# Patient Record
Sex: Female | Born: 1975 | Hispanic: No | Marital: Married | Smoking: Never smoker
Health system: Southern US, Community
[De-identification: ages and names within clinical notes are randomized; demographics above are authoritative.]

## PROBLEM LIST (undated history)

## (undated) DIAGNOSIS — E039 Hypothyroidism, unspecified: Secondary | ICD-10-CM

## (undated) HISTORY — DX: Hypothyroidism, unspecified: E03.9

---

## 2002-10-05 ENCOUNTER — Other Ambulatory Visit: Admission: RE | Admit: 2002-10-05 | Discharge: 2002-10-05 | Payer: Self-pay | Admitting: Obstetrics and Gynecology

## 2003-02-20 ENCOUNTER — Ambulatory Visit (HOSPITAL_COMMUNITY): Admission: AD | Admit: 2003-02-20 | Discharge: 2003-02-20 | Payer: Self-pay | Admitting: Obstetrics and Gynecology

## 2003-03-24 ENCOUNTER — Inpatient Hospital Stay (HOSPITAL_COMMUNITY): Admission: AD | Admit: 2003-03-24 | Discharge: 2003-03-26 | Payer: Self-pay | Admitting: Family Medicine

## 2004-02-22 ENCOUNTER — Emergency Department (HOSPITAL_COMMUNITY): Admission: EM | Admit: 2004-02-22 | Discharge: 2004-02-22 | Payer: Self-pay | Admitting: Emergency Medicine

## 2004-10-13 ENCOUNTER — Ambulatory Visit: Payer: Self-pay | Admitting: Family Medicine

## 2004-12-16 ENCOUNTER — Ambulatory Visit: Payer: Self-pay | Admitting: Family Medicine

## 2004-12-27 ENCOUNTER — Ambulatory Visit: Payer: Self-pay | Admitting: *Deleted

## 2005-01-12 ENCOUNTER — Ambulatory Visit: Payer: Self-pay | Admitting: Family Medicine

## 2005-01-26 ENCOUNTER — Ambulatory Visit: Payer: Self-pay | Admitting: Nurse Practitioner

## 2005-01-29 ENCOUNTER — Encounter: Admission: RE | Admit: 2005-01-29 | Discharge: 2005-01-29 | Payer: Self-pay | Admitting: Family Medicine

## 2005-02-11 ENCOUNTER — Ambulatory Visit: Payer: Self-pay | Admitting: Nurse Practitioner

## 2005-02-17 ENCOUNTER — Ambulatory Visit (HOSPITAL_COMMUNITY): Admission: RE | Admit: 2005-02-17 | Discharge: 2005-02-17 | Payer: Self-pay | Admitting: Internal Medicine

## 2005-03-17 ENCOUNTER — Ambulatory Visit: Payer: Self-pay | Admitting: Nurse Practitioner

## 2005-05-24 ENCOUNTER — Ambulatory Visit: Payer: Self-pay | Admitting: Family Medicine

## 2005-06-08 ENCOUNTER — Ambulatory Visit: Payer: Self-pay | Admitting: Family Medicine

## 2005-08-17 ENCOUNTER — Encounter (INDEPENDENT_AMBULATORY_CARE_PROVIDER_SITE_OTHER): Payer: Self-pay | Admitting: Family Medicine

## 2005-08-17 ENCOUNTER — Ambulatory Visit: Payer: Self-pay | Admitting: Family Medicine

## 2005-12-22 ENCOUNTER — Ambulatory Visit: Payer: Self-pay | Admitting: Family Medicine

## 2006-02-16 ENCOUNTER — Ambulatory Visit: Payer: Self-pay | Admitting: Family Medicine

## 2006-05-12 ENCOUNTER — Ambulatory Visit: Payer: Self-pay | Admitting: Nurse Practitioner

## 2006-07-07 ENCOUNTER — Ambulatory Visit: Payer: Self-pay | Admitting: Internal Medicine

## 2006-08-03 ENCOUNTER — Ambulatory Visit: Payer: Self-pay | Admitting: Family Medicine

## 2006-08-31 ENCOUNTER — Ambulatory Visit: Payer: Self-pay | Admitting: Family Medicine

## 2006-09-14 ENCOUNTER — Ambulatory Visit (HOSPITAL_COMMUNITY): Admission: RE | Admit: 2006-09-14 | Discharge: 2006-09-14 | Payer: Self-pay | Admitting: Family Medicine

## 2006-12-06 ENCOUNTER — Ambulatory Visit: Payer: Self-pay | Admitting: Family Medicine

## 2006-12-21 ENCOUNTER — Encounter (INDEPENDENT_AMBULATORY_CARE_PROVIDER_SITE_OTHER): Payer: Self-pay | Admitting: *Deleted

## 2007-01-17 ENCOUNTER — Ambulatory Visit: Payer: Self-pay | Admitting: Family Medicine

## 2007-02-09 ENCOUNTER — Ambulatory Visit: Payer: Self-pay | Admitting: Family Medicine

## 2007-02-15 ENCOUNTER — Ambulatory Visit (HOSPITAL_COMMUNITY): Admission: RE | Admit: 2007-02-15 | Discharge: 2007-02-15 | Payer: Self-pay | Admitting: Family Medicine

## 2007-04-11 ENCOUNTER — Ambulatory Visit: Payer: Self-pay | Admitting: Family Medicine

## 2007-04-11 LAB — CONVERTED CEMR LAB
ALT: 13 units/L (ref 0–35)
AST: 17 units/L (ref 0–37)
Albumin: 4.2 g/dL (ref 3.5–5.2)
Alkaline Phosphatase: 46 units/L (ref 39–117)
BUN: 13 mg/dL (ref 6–23)
Basophils Absolute: 0 10*3/uL (ref 0.0–0.1)
Basophils Relative: 0 % (ref 0–1)
CO2: 23 meq/L (ref 19–32)
Calcium: 9 mg/dL (ref 8.4–10.5)
Chloride: 103 meq/L (ref 96–112)
Cholesterol: 170 mg/dL (ref 0–200)
Creatinine, Ser: 0.62 mg/dL (ref 0.40–1.20)
Eosinophils Absolute: 0 10*3/uL (ref 0.0–0.7)
Eosinophils Relative: 0 % (ref 0–5)
Glucose, Bld: 83 mg/dL (ref 70–99)
HCT: 40.9 % (ref 36.0–46.0)
HDL: 54 mg/dL (ref >39)
Hemoglobin: 14.2 g/dL (ref 12.0–15.0)
LDL Cholesterol: 90 mg/dL (ref 0–99)
Lymphocytes Relative: 45 % (ref 12–46)
Lymphs Abs: 2.3 10*3/uL (ref 0.7–4.0)
MCHC: 34.7 g/dL (ref 30.0–36.0)
MCV: 92.5 fL (ref 78.0–100.0)
Monocytes Absolute: 0.4 10*3/uL (ref 0.1–1.0)
Monocytes Relative: 7 % (ref 3–12)
Neutro Abs: 2.4 10*3/uL (ref 1.7–7.7)
Neutrophils Relative %: 47 % (ref 43–77)
Platelets: 251 10*3/uL (ref 150–400)
Potassium: 4.3 meq/L (ref 3.5–5.3)
RBC: 4.42 M/uL (ref 3.87–5.11)
RDW: 12.8 % (ref 11.5–15.5)
Sed Rate: 8 mm/hr (ref 0–22)
Sodium: 137 meq/L (ref 135–145)
TSH: 9.236 microintl units/mL — ABNORMAL HIGH (ref 0.350–5.50)
Total Bilirubin: 1.4 mg/dL — ABNORMAL HIGH (ref 0.3–1.2)
Total CHOL/HDL Ratio: 3.1
Total Protein: 7.1 g/dL (ref 6.0–8.3)
Triglycerides: 130 mg/dL (ref <150)
VLDL: 26 mg/dL (ref 0–40)
WBC: 5.1 10*3/uL (ref 4.0–10.5)

## 2007-04-12 ENCOUNTER — Ambulatory Visit (HOSPITAL_COMMUNITY): Admission: RE | Admit: 2007-04-12 | Discharge: 2007-04-12 | Payer: Self-pay | Admitting: Family Medicine

## 2007-08-16 ENCOUNTER — Ambulatory Visit: Payer: Self-pay | Admitting: Family Medicine

## 2007-08-16 LAB — CONVERTED CEMR LAB
Free T4: 1.11 ng/dL (ref 0.89–1.80)
TSH: 2.755 microintl units/mL (ref 0.350–5.50)

## 2007-08-29 ENCOUNTER — Ambulatory Visit: Payer: Self-pay | Admitting: Family Medicine

## 2007-09-19 ENCOUNTER — Ambulatory Visit: Payer: Self-pay | Admitting: Internal Medicine

## 2008-02-08 ENCOUNTER — Ambulatory Visit: Payer: Self-pay | Admitting: Family Medicine

## 2008-04-05 DIAGNOSIS — E039 Hypothyroidism, unspecified: Secondary | ICD-10-CM

## 2008-04-05 HISTORY — DX: Hypothyroidism, unspecified: E03.9

## 2008-06-03 ENCOUNTER — Ambulatory Visit: Payer: Self-pay | Admitting: Internal Medicine

## 2008-10-15 ENCOUNTER — Encounter (INDEPENDENT_AMBULATORY_CARE_PROVIDER_SITE_OTHER): Payer: Self-pay | Admitting: Family Medicine

## 2008-10-15 ENCOUNTER — Ambulatory Visit: Payer: Self-pay | Admitting: Family Medicine

## 2008-11-12 ENCOUNTER — Ambulatory Visit: Payer: Self-pay | Admitting: Family Medicine

## 2008-11-12 LAB — CONVERTED CEMR LAB
Free T4: 0.81 ng/dL (ref 0.80–1.80)
TSH: 14.15 microintl units/mL — ABNORMAL HIGH (ref 0.350–4.500)

## 2008-11-19 ENCOUNTER — Ambulatory Visit: Payer: Self-pay | Admitting: Family Medicine

## 2008-12-31 ENCOUNTER — Ambulatory Visit: Payer: Self-pay | Admitting: Family Medicine

## 2008-12-31 LAB — CONVERTED CEMR LAB
Free T4: 1.23 ng/dL (ref 0.80–1.80)
TSH: 3.399 microintl units/mL (ref 0.350–4.500)

## 2009-01-09 ENCOUNTER — Ambulatory Visit: Payer: Self-pay | Admitting: Internal Medicine

## 2009-04-08 ENCOUNTER — Ambulatory Visit: Payer: Self-pay | Admitting: Family Medicine

## 2009-04-08 LAB — CONVERTED CEMR LAB
ALT: 24 units/L (ref 0–35)
AST: 19 units/L (ref 0–37)
Albumin: 4.6 g/dL (ref 3.5–5.2)
Alkaline Phosphatase: 56 units/L (ref 39–117)
BUN: 11 mg/dL (ref 6–23)
CO2: 24 meq/L (ref 19–32)
Calcium: 9.2 mg/dL (ref 8.4–10.5)
Chlamydia, Swab/Urine, PCR: NEGATIVE
Chloride: 103 meq/L (ref 96–112)
Creatinine, Ser: 0.61 mg/dL (ref 0.40–1.20)
GC Probe Amp, Urine: NEGATIVE
Glucose, Bld: 88 mg/dL (ref 70–99)
Potassium: 4.4 meq/L (ref 3.5–5.3)
Sodium: 139 meq/L (ref 135–145)
Total Bilirubin: 1.1 mg/dL (ref 0.3–1.2)
Total Protein: 7.2 g/dL (ref 6.0–8.3)

## 2009-04-17 ENCOUNTER — Ambulatory Visit: Payer: Self-pay | Admitting: Family Medicine

## 2009-04-17 LAB — CONVERTED CEMR LAB
Chlamydia, DNA Probe: NEGATIVE
GC Probe Amp, Genital: NEGATIVE

## 2009-08-12 ENCOUNTER — Ambulatory Visit: Payer: Self-pay | Admitting: Family Medicine

## 2009-08-12 LAB — CONVERTED CEMR LAB
ALT: <8 units/L (ref 0–35)
AST: 13 units/L (ref 0–37)
Albumin: 4.8 g/dL (ref 3.5–5.2)
Alkaline Phosphatase: 48 units/L (ref 39–117)
Bilirubin, Direct: 0.2 mg/dL (ref 0.0–0.3)
Free T4: 1.35 ng/dL (ref 0.80–1.80)
Helicobacter Pylori Antibody-IgG: 0.5
Indirect Bilirubin: 0.8 mg/dL (ref 0.0–0.9)
TSH: 2.62 microintl units/mL (ref 0.350–4.500)
Total Bilirubin: 1 mg/dL (ref 0.3–1.2)
Total Protein: 7.8 g/dL (ref 6.0–8.3)
Vit D, 25-Hydroxy: 24 ng/mL — ABNORMAL LOW (ref 30–89)

## 2009-09-25 ENCOUNTER — Ambulatory Visit: Payer: Self-pay | Admitting: Family Medicine

## 2009-12-02 ENCOUNTER — Ambulatory Visit: Payer: Self-pay | Admitting: Family Medicine

## 2010-03-03 ENCOUNTER — Encounter (INDEPENDENT_AMBULATORY_CARE_PROVIDER_SITE_OTHER): Payer: Self-pay | Admitting: Family Medicine

## 2010-03-03 LAB — CONVERTED CEMR LAB
Basophils Absolute: 0 10*3/uL (ref 0.0–0.1)
Basophils Relative: 1 % (ref 0–1)
Eosinophils Absolute: 0 10*3/uL (ref 0.0–0.7)
Eosinophils Relative: 1 % (ref 0–5)
Free T4: 1.04 ng/dL (ref 0.80–1.80)
HCT: 43.6 % (ref 36.0–46.0)
Hemoglobin: 14.8 g/dL (ref 12.0–15.0)
Lymphocytes Relative: 48 % — ABNORMAL HIGH (ref 12–46)
Lymphs Abs: 2 10*3/uL (ref 0.7–4.0)
MCHC: 33.9 g/dL (ref 30.0–36.0)
MCV: 93.6 fL (ref 78.0–100.0)
Monocytes Absolute: 0.3 10*3/uL (ref 0.1–1.0)
Monocytes Relative: 8 % (ref 3–12)
Neutro Abs: 1.8 10*3/uL (ref 1.7–7.7)
Neutrophils Relative %: 43 % (ref 43–77)
Platelets: 261 10*3/uL (ref 150–400)
RBC: 4.66 M/uL (ref 3.87–5.11)
RDW: 12.4 % (ref 11.5–15.5)
Rheumatoid fact SerPl-aCnc: <20 intl units/mL (ref 0–20)
Sed Rate: 4 mm/hr (ref 0–22)
TSH: 4.359 microintl units/mL (ref 0.350–4.500)
Uric Acid, Serum: 4.8 mg/dL (ref 2.4–7.0)
WBC: 4.2 10*3/uL (ref 4.0–10.5)

## 2010-07-21 ENCOUNTER — Other Ambulatory Visit: Payer: Self-pay | Admitting: Family Medicine

## 2010-07-21 DIAGNOSIS — N63 Unspecified lump in unspecified breast: Secondary | ICD-10-CM

## 2010-07-29 ENCOUNTER — Ambulatory Visit
Admission: RE | Admit: 2010-07-29 | Discharge: 2010-07-29 | Disposition: A | Discharge status: Home / Self Care | Payer: PRIVATE HEALTH INSURANCE | Source: Ambulatory Visit | Attending: Family Medicine | Admitting: Family Medicine

## 2010-07-29 ENCOUNTER — Other Ambulatory Visit: Payer: Self-pay | Admitting: Family Medicine

## 2010-07-29 DIAGNOSIS — N63 Unspecified lump in unspecified breast: Secondary | ICD-10-CM

## 2011-09-15 ENCOUNTER — Other Ambulatory Visit (HOSPITAL_COMMUNITY): Payer: Self-pay | Admitting: Family Medicine

## 2011-09-15 DIAGNOSIS — R102 Pelvic and perineal pain: Secondary | ICD-10-CM

## 2011-09-15 DIAGNOSIS — N923 Ovulation bleeding: Secondary | ICD-10-CM

## 2011-09-22 ENCOUNTER — Other Ambulatory Visit (HOSPITAL_COMMUNITY): Payer: Self-pay

## 2011-09-22 ENCOUNTER — Inpatient Hospital Stay (HOSPITAL_COMMUNITY): Admission: RE | Admit: 2011-09-22 | Payer: Self-pay | Source: Ambulatory Visit

## 2011-09-30 ENCOUNTER — Ambulatory Visit (HOSPITAL_COMMUNITY)
Admission: RE | Admit: 2011-09-30 | Discharge: 2011-09-30 | Disposition: A | Discharge status: Home / Self Care | Payer: Self-pay | Source: Ambulatory Visit | Attending: Family Medicine | Admitting: Family Medicine

## 2011-09-30 DIAGNOSIS — R102 Pelvic and perineal pain: Secondary | ICD-10-CM

## 2011-09-30 DIAGNOSIS — N898 Other specified noninflammatory disorders of vagina: Secondary | ICD-10-CM | POA: Insufficient documentation

## 2011-09-30 DIAGNOSIS — N923 Ovulation bleeding: Secondary | ICD-10-CM

## 2012-03-28 ENCOUNTER — Encounter (HOSPITAL_COMMUNITY): Payer: Self-pay

## 2012-03-28 ENCOUNTER — Emergency Department (HOSPITAL_COMMUNITY)
Admission: EM | Admit: 2012-03-28 | Discharge: 2012-03-28 | Disposition: A | Discharge status: Home / Self Care | Payer: No Typology Code available for payment source | Source: Home / Self Care

## 2012-03-28 DIAGNOSIS — E039 Hypothyroidism, unspecified: Secondary | ICD-10-CM

## 2012-03-28 LAB — TSH: TSH: 3.846 u[IU]/mL (ref 0.350–4.500)

## 2012-03-28 MED ORDER — NORGESTIM-ETH ESTRAD TRIPHASIC 0.18/0.215/0.25 MG-35 MCG PO TABS: 1.0000 | ORAL_TABLET | Freq: Every day | ORAL | Status: DC

## 2012-03-28 MED ORDER — LEVOTHYROXINE SODIUM 100 MCG PO TABS: 100.0000 ug | ORAL_TABLET | Freq: Every day | ORAL | Status: DC

## 2012-03-28 NOTE — ED Notes (Signed)
Medication refill- thyroid 

## 2012-03-28 NOTE — ED Provider Notes (Addendum)
History     CSN: [id number]  Arrival date & time 03/28/12  1111   First MD Initiated Contact with Patient 03/28/12 1214      Chief Complaint  Patient presents with  . Medication Refill   Patient is here today for medication refill. No complaints today.  (Consider location/radiation/quality/duration/timing/severity/associated sxs/prior treatment) HPI  History reviewed. No pertinent past medical history.  History reviewed. No pertinent past surgical history.  No family history on file.  History  Substance Use Topics  . Smoking status: Not on file  . Smokeless tobacco: Not on file  . Alcohol Use: Not on file    OB History    Grav Para Term Preterm Abortions TAB SAB Ect Mult Living                  Review of Systems  Constitutional: Negative for fever, activity change and appetite change.  HENT: Negative for sore throat.   Respiratory: Negative for cough and shortness of breath.   Cardiovascular: Negative for chest pain and leg swelling.  Gastrointestinal: Negative for nausea, abdominal pain, diarrhea, constipation and abdominal distention.  Genitourinary: Negative for frequency, hematuria and difficulty urinating.  Neurological: Negative for dizziness and headaches.  Psychiatric/Behavioral: Negative for suicidal ideas and behavioral problems.    Allergies  Penicillins  Home Medications  No current outpatient prescriptions on file.  BP 129/86  Pulse 72  Temp 98.9 F (37.2 C) (Oral)  Resp 20  SpO2 100%  LMP 03/24/2012  Physical Exam  Constitutional: She is oriented to person, place, and time. She appears well-developed and well-nourished.  HENT:  Head: Normocephalic and atraumatic.  Eyes: Conjunctivae normal and EOM are normal. Pupils are equal, round, and reactive to light. No scleral icterus.  Neck: Normal range of motion. Neck supple. No JVD present. No thyromegaly present.  Cardiovascular: Normal rate, regular rhythm, normal heart sounds and intact  distal pulses.  Exam reveals no gallop and no friction rub.   No murmur heard. Pulmonary/Chest: Effort normal and breath sounds normal. No respiratory distress. She has no wheezes. She has no rales.  Abdominal: Soft. Bowel sounds are normal. She exhibits no distension and no mass. There is no tenderness. There is no rebound and no guarding.  Musculoskeletal: Normal range of motion. She exhibits no edema and no tenderness.  Lymphadenopathy:    She has no cervical adenopathy.  Neurological: She is alert and oriented to person, place, and time.  Psychiatric: She has a normal mood and affect. Her behavior is normal.    ED Course  Procedures (including critical care time)  Labs Reviewed - No data to display No results found.   No diagnosis found.    MDM  Check TSH today and refill both thyroid medication and birth control medication.        Lars Mage, MD 03/28/12 1230  Lars Mage, MD 03/28/12 1235

## 2012-10-25 ENCOUNTER — Ambulatory Visit: Payer: No Typology Code available for payment source | Attending: Family Medicine | Admitting: Internal Medicine

## 2012-10-25 VITALS — BP 126/76 | HR 66 | Temp 98.3°F | Resp 18 | Wt 153.8 lb

## 2012-10-25 DIAGNOSIS — N390 Urinary tract infection, site not specified: Secondary | ICD-10-CM | POA: Insufficient documentation

## 2012-10-25 DIAGNOSIS — Z0000 Encounter for general adult medical examination without abnormal findings: Secondary | ICD-10-CM | POA: Insufficient documentation

## 2012-10-25 DIAGNOSIS — Z79899 Other long term (current) drug therapy: Secondary | ICD-10-CM | POA: Insufficient documentation

## 2012-10-25 DIAGNOSIS — R300 Dysuria: Secondary | ICD-10-CM

## 2012-10-25 DIAGNOSIS — E039 Hypothyroidism, unspecified: Secondary | ICD-10-CM | POA: Insufficient documentation

## 2012-10-25 DIAGNOSIS — K5900 Constipation, unspecified: Secondary | ICD-10-CM | POA: Insufficient documentation

## 2012-10-25 LAB — POCT URINALYSIS DIPSTICK
Bilirubin, UA: NEGATIVE
Glucose, UA: NEGATIVE
Ketones, UA: NEGATIVE
Nitrite, UA: POSITIVE
Spec Grav, UA: 1.02
Urobilinogen, UA: 0.2
pH, UA: 7

## 2012-10-25 MED ORDER — LEVOTHYROXINE SODIUM 100 MCG PO TABS: 100.0000 ug | ORAL_TABLET | Freq: Every day | ORAL | Status: DC

## 2012-10-25 MED ORDER — CIPROFLOXACIN HCL 500 MG PO TABS: 500.0000 mg | ORAL_TABLET | Freq: Two times a day (BID) | ORAL | Status: DC

## 2012-10-25 MED ORDER — NORGESTIM-ETH ESTRAD TRIPHASIC 0.18/0.215/0.25 MG-35 MCG PO TABS: 1.0000 | ORAL_TABLET | Freq: Every day | ORAL | Status: DC

## 2012-10-25 MED ORDER — SENNOSIDES-DOCUSATE SODIUM 8.6-50 MG PO TABS: 1.0000 | ORAL_TABLET | Freq: Every day | ORAL | Status: DC

## 2012-10-25 NOTE — Progress Notes (Signed)
Interpreter states patient is here for a physical Has not been to a doctor in a while

## 2012-10-25 NOTE — Progress Notes (Signed)
Patient ID: Crystal Fleming, female   DOB: 1975-04-09, 37 y.o.   MRN: [id number] Patient Demographics  Crystal Fleming, is a 37 y.o. female  NUU:[id number]  HKV:[id number]  DOB - 03/25/1976  Chief Complaint  Patient presents with  . Annual Exam        Subjective:   Crystal Fleming, Briefly 37 year old female with history of hypothyroidism is here for a follow up visit.  Patient has No headache, No chest pain,  No new weakness tingling or numbness, No Cough - SOB. Briefly 37 year old female with history of hypothyroidism She has been complaining of dysuria and difficulty urination for last one week. She has been drinking cranberry juice which did help with the symptoms somewhat. She denies any abdominal/flank pain, fever chills, nausea, vomiting. Also complains of constipation, states MiraLax does not help. Last Pap smear was 3 years ago.   Objective:    Filed Vitals:   10/25/12 1027  BP: 126/76  Pulse: 66  Temp: 98.3 F (36.8 C)  Resp: 18  Weight: 153 lb 12.8 oz (69.763 kg)  SpO2: 100%     ALLERGIES:   Allergies  Allergen Reactions  . Penicillins     PAST MEDICAL HISTORY: History reviewed. No pertinent past medical history.  MEDICATIONS AT HOME: Prior to Admission medications   Medication Sig Start Date End Date Taking? Authorizing Provider  levothyroxine (SYNTHROID) 100 MCG tablet Take 1 tablet (100 mcg total) by mouth daily. 10/25/12 10/25/13  Elnathan Fulford Jenna Luo, MD  Norgestimate-Ethinyl Estradiol Triphasic (ORTHO TRI-CYCLEN, 28,) 0.18/0.215/0.25 MG-35 MCG tablet Take 1 tablet by mouth daily. 10/25/12   Sheralyn Pinegar Jenna Luo, MD     Exam  General appearance :Awake, alert, NAD, Speech Clear.  HEENT: Atraumatic and Normocephalic, PERLA Neck: supple, no JVD. No cervical lymphadenopathy.  Chest: Clear to auscultation bilaterally, no wheezing, rales or rhonchi CVS: S1 S2 regular, no murmurs.  Abdomen: soft, NBS, NT, ND, no gaurding, rigidity or  rebound. Extremities: no cyanosis or clubbing, B/L Lower Ext shows no edema Neurology: Awake alert, and oriented X 3, CN II-XII intact, Non focal Skin: No Rash or lesions Wounds:N/A    Data Review   Basic Metabolic Panel: No results found for this basename: NA, K, CL, CO2, GLUCOSE, BUN, CREATININE, CALCIUM, MG, PHOS,  in the last 168 hours Liver Function Tests: No results found for this basename: AST, ALT, ALKPHOS, BILITOT, PROT, ALBUMIN,  in the last 168 hours  CBC: No results found for this basename: WBC, NEUTROABS, HGB, HCT, MCV, PLT,  in the last 168 hours  ------------------------------------------------------------------------------------------------------------------ No results found for this basename: HGBA1C,  in the last 72 hours ------------------------------------------------------------------------------------------------------------------ No results found for this basename: CHOL, HDL, LDLCALC, TRIG, CHOLHDL, LDLDIRECT,  in the last 72 hours ------------------------------------------------------------------------------------------------------------------ No results found for this basename: TSH, T4TOTAL, FREET3, T3FREE, THYROIDAB,  in the last 72 hours ------------------------------------------------------------------------------------------------------------------ No results found for this basename: VITAMINB12, FOLATE, FERRITIN, TIBC, IRON, RETICCTPCT,  in the last 72 hours  Coagulation profile  No results found for this basename: INR, PROTIME,  in the last 168 hours    Assessment & Plan   Active Problems: Hypothroidism - Check TSH, continue Synthroid 100 mcg daily  Constipation - Placed on Senokot S., 1 or 2 tabs at night - encouraged high-fiber diet  Dysuria - Recheck urine dipstick, positive for UTI, place on ciprofloxacin x 3 days, encouraged drinking plenty of fluids, cranberry juice  Health maintenance: last Pap 3 years ago, placed ambulatory referral  to [facility] women[facility] clinic. Refilled  Ortho Tri-Cyclen.  Labs to be checked: UA dipstick ASAP, the patient had no recent labs. Will check CBC, BMET, TSH, lipid panel. She wants them checked today.   Recommendations: f/u on the labs, if anything abnormal will need earlier appointment  Follow-up in 6 months     Keng Jewel M.D. 10/25/2012, 11:06 AM

## 2012-10-26 LAB — CBC WITH DIFFERENTIAL/PLATELET
Basophils Absolute: 0 10*3/uL (ref 0.0–0.1)
Basophils Relative: 0 % (ref 0–1)
Eosinophils Absolute: 0 10*3/uL (ref 0.0–0.7)
Eosinophils Relative: 0 % (ref 0–5)
HCT: 40.4 % (ref 36.0–46.0)
Hemoglobin: 14.2 g/dL (ref 12.0–15.0)
Lymphocytes Relative: 34 % (ref 12–46)
Lymphs Abs: 1.9 10*3/uL (ref 0.7–4.0)
MCH: 32.1 pg (ref 26.0–34.0)
MCHC: 35.1 g/dL (ref 30.0–36.0)
MCV: 91.4 fL (ref 78.0–100.0)
Monocytes Absolute: 0.3 10*3/uL (ref 0.1–1.0)
Monocytes Relative: 6 % (ref 3–12)
Neutro Abs: 3.3 10*3/uL (ref 1.7–7.7)
Neutrophils Relative %: 60 % (ref 43–77)
Platelets: 281 10*3/uL (ref 150–400)
RBC: 4.42 MIL/uL (ref 3.87–5.11)
RDW: 12.6 % (ref 11.5–15.5)
WBC: 5.6 10*3/uL (ref 4.0–10.5)

## 2012-10-26 LAB — LIPID PANEL
Cholesterol: 211 mg/dL — ABNORMAL HIGH (ref 0–200)
HDL: 49 mg/dL (ref >39)
LDL Cholesterol: 121 mg/dL — ABNORMAL HIGH (ref 0–99)
Total CHOL/HDL Ratio: 4.3 Ratio
Triglycerides: 207 mg/dL — ABNORMAL HIGH (ref <150)
VLDL: 41 mg/dL — ABNORMAL HIGH (ref 0–40)

## 2012-10-26 LAB — BASIC METABOLIC PANEL
BUN: 9 mg/dL (ref 6–23)
CO2: 29 mEq/L (ref 19–32)
Calcium: 9.4 mg/dL (ref 8.4–10.5)
Chloride: 100 mEq/L (ref 96–112)
Creat: 0.58 mg/dL (ref 0.50–1.10)
Glucose, Bld: 81 mg/dL (ref 70–99)
Potassium: 4.3 mEq/L (ref 3.5–5.3)
Sodium: 138 mEq/L (ref 135–145)

## 2012-10-26 LAB — TSH: TSH: 3.294 u[IU]/mL (ref 0.350–4.500)

## 2012-10-30 ENCOUNTER — Encounter: Payer: Self-pay | Admitting: Obstetrics and Gynecology

## 2012-12-06 ENCOUNTER — Encounter: Payer: No Typology Code available for payment source | Admitting: Obstetrics and Gynecology

## 2012-12-20 ENCOUNTER — Ambulatory Visit: Payer: No Typology Code available for payment source

## 2013-01-17 ENCOUNTER — Encounter: Payer: Self-pay | Admitting: Obstetrics and Gynecology

## 2013-01-17 ENCOUNTER — Ambulatory Visit (INDEPENDENT_AMBULATORY_CARE_PROVIDER_SITE_OTHER): Payer: No Typology Code available for payment source | Admitting: Obstetrics and Gynecology

## 2013-01-17 VITALS — BP 141/92 | HR 78 | Temp 98.5°F | Ht 61.0 in | Wt 154.4 lb

## 2013-01-17 DIAGNOSIS — R300 Dysuria: Secondary | ICD-10-CM

## 2013-01-17 DIAGNOSIS — Z01419 Encounter for gynecological examination (general) (routine) without abnormal findings: Secondary | ICD-10-CM

## 2013-01-17 LAB — POCT URINALYSIS DIP (DEVICE)
Protein, ur: NEGATIVE mg/dL
Urobilinogen, UA: 0.2 mg/dL (ref 0.0–1.0)

## 2013-01-17 MED ORDER — NORGESTIMATE-ETH ESTRADIOL 0.25-35 MG-MCG PO TABS: 1.0000 | ORAL_TABLET | Freq: Every day | ORAL | Status: DC

## 2013-01-17 MED ORDER — NITROFURANTOIN MONOHYD MACRO 100 MG PO CAPS: 100.0000 mg | ORAL_CAPSULE | Freq: Two times a day (BID) | ORAL | Status: DC

## 2013-01-17 NOTE — Patient Instructions (Signed)
Cuidados preventivos en las mujeres adultas  (Preventive Care for Adults, Female) Un estilo de vida saludable y los cuidados preventivos pueden favorecer la salud y [place]. Las guas para Engineer, building services salud para las mujeres incluyen las siguientes prcticas clave:   Un examen fsico de rutina anual es un buen modo de Chief Operating Officer su salud y Education officer, environmental estudios preventivos. Le da la posibilidad de Agricultural consultant preocupaciones y Solicitor el estado de su salud, y que le realicen estudios completos.  Consulte al dentista para realizar un examen de rutina y cuidados preventivos cada 6 meses. Cepllese los dientes al [employer] veces por da y psese el hilo dental al menos una vez por da. Una buena higiene bucal evita caries y enfermedades de las encas.  La frecuencia con que debe hacerse exmenes de la vista depende de la edad, el estado de [place], la historia familiar, el uso de lentes de contacto y otros factores. Siga las recomendaciones del mdico para saber con qu frecuencia debe hacerse exmenes de la vista.  Consuma una dieta saludable. Los Sun Microsystems, frutas, granos enteros, productos lcteos descremados y protenas magras contienen los nutrientes que usted necesita sin necesidad de consumir muchas caloras. Disminuya el consume de alimentos con alto contenido de grasas slidas, azcar y sal agregadas. Consuma la cantidad de caloras adecuada para usted.Si es necesario, pdale una dieta adecuada al profesional que lo asiste.  La actividad fsica regular es una de las cosas ms importantes que puede hacer por su salud. Los adultos deben hacer al menos 150 minutos de ejercicios de [place] de intensidad moderada (toda actividad que aumente la frecuencia cardaca y lo haga transpirar) cada semana. Adems, la Harley[employer]Davidson de los adultos necesita ejercicios de fortalecimiento muscular 2  ms [employer] and [employer].  Hay que mantener un peso saludable. El ndice de masa corporal [facility]) es una  herramienta que identifica posibles problemas con [place]. Proporciona una estimacin de la grasa corporal basndose en el peso y la altura. [name] podr determinar si [facility] y podr ayudarlo a Personnel officer o Pharmacologist un peso saludable.Para los adultos de 20 aos o ms:  Un [facility] menor a 18,5 se considera bajo peso.  Un [facility] entre 18,5 y 24,9 es normal.  Un [facility] entre 25 y 29,9 es sobrepeso.  Un IMC entre 30 o ms es obesidad.  Mantenga un nivel normal de lpidos y colesterol en sangre practicando actividad fsica y minimizando la ingesta de grasas saturadas. Consuma una dieta balanceada y saludable, e incluya variedad de frutas y vegetales. Los [employer] de lpidos y Oncologist en sangre deben Games developer a los 20 aos y repetirse cada 5 aos. Si los niveles de colesterol son altos, tiene ms de 50 aos o tiene riesgo elevado de sufrir enfermedades cardacas necesitar controlarse con ms frecuencia.Si tiene [employer] Inc de lpidos y colesterol, debe recibir tratamiento con medicamentos, si la dieta y el ejercicio no son efectivos.  Si fuma, consulte con [place] [employer] de las opciones para dejar de [place]. Si no lo hace, no comience.  Si est embarazada no beba alcohol. Si est amamantando, beba alcohol con prudencia. Si elige beber alcohol, no se exceda de 1 medida por da. Se considera una medida a 12 onzas (355 ml) de cerveza, 5 onzas (148 ml) de vino, o 1,5 onzas (44 ml) de licor.  Evite el alcohol y el consumo de drogas. No comparta agujas. Pida ayuda si necesita asistencia o instrucciones con respecto a abandonar el consumo de alcohol, cigarrillos o  drogas.  La hipertensin arterial causa enfermedades cardacas y [place] el riesgo de ictus. Debe controlar su presin arterial al menos cada 1 o 2 aos. La presin arterial elevada que persiste debe tratarse con medicamentos si la prdida de peso y el ejercicio no son efectivos.  Si tiene entre 55 y 34 aos, consulte a su mdico si debe tomar  aspirina para prevenir enfermedades cardacas.  Los anlisis para la diabetes incluyen la toma de [place] de sangre para controlar el nivel de azcar en la sangre durante el [place]. Debe hacerlo cada 3 aos despus de los 45 aos si est dentro de su peso normal y sin factores de riesgo para la diabetes. Las pruebas deben comenzar a edades tempranas o llevarse a cabo con ms frecuencia si tiene sobrepeso y al menos 1 factor de riesgo para la diabetes.  Las evaluaciones para Market researcher de mama son un mtodo preventivo fundamental para las mujeres. Debe practicar la "autoconciencia de las mamas". Esto significa que debe comprender como es la apariencia normal y como se sienten sus mamas e incluir un autoexamen. Si detecta algn cambio, no importa cun pequeo sea, debe informarlo a su mdico. Las mujeres entre 20 y 30 aos deben hacer un examen clnico de las mamas como parte del examen regular de [place], cada 1 a 3 aos. Despus de los 40 aos deben Sprint [employer] Corporation. Deben hacerse una mamografa cada ao, comenzando a los 40 aos. Las mujeres con Brewing technologist de cncer de mama deben hablar con el mdico para hacer un estudio gentico. Las que tienen ms riesgo deben hacerse una ecografa y [place] mamografa todos [place].  Un papanicolau se realiza para diagnosticar cncer de cuello de tero. Muestra los cambios celulares en el cuello que pueden transformarse en cncer si no se tratan. El papanicolau es un procedimiento por el que se obtienen clulas de la parte inferior del tero (cuello) y son examinadas.  Las mujeres deben [employer] un papanicolau a Glass blower/designer de los [address].  [place] 21 y los 29 aos debe repetirse [address].  Luego de los 30 aos, debe realizarse un Papanicolaou cada tres aos siempre que los 3 estudios anteriores sean normales.  Algunas mujeres sufren problemas mdicos que aumenta la probabilidad de Research officer, political party cervical. Consulte a su mdico acerca de estos  problemas. Es muy importante que le informe a su mdico si aparecen nuevos problemas poco despus de su ltimo Papanicolaou. En estos casos, el mdico podr indicar que se realice el Papanicolaou con ms frecuencia.  Estas recomendaciones son las mismas para todas las mujeres hayan recibido o no la vacuna para el VPH (virus del papiloma humano).  Si le han realizado una histerectoma por un problema que no era cncer u otra enfermedad que podra causar cncer, ya no necesitar un Papanicolaou. Sin embargo, si ya no necesita hacerse un Papanicolau, es una buena idea hacerse un examen regularmente para asegurarse de que no hay otros problemas.  Si tiene entre 65 y 55 aos y ha tenido un Papanicolaou normal en los ltimos 10 aos, ya no ser Music therapist. Sin embargo, si ya no necesita hacerse un Papanicolau, es una buena idea hacerse un examen regularmente para asegurarse de que no hay otros problemas.  Si ha recibido un tratamiento para Management consultant cervical o para una enfermedad que podra causar cncer, Musician un Papanicolaou y Paediatric nurse durante al menos 20 aos de concluir el [place].  Si no se ha  hecho el examen con regularidad, debern volver a evaluarse los factores de riesgo (como el tener un nuevo compaero sexual) para Occupational psychologist a Printmaker.  La prueba del VPH es un anlisis adicional que puede usarse para Engineer, site de cuello de tero. Esta prueba busca la presencia del virus que causa los cambios en el cuello. Las [employer]AGE se recolectan durante el papanicolau pueden usarse para el VPH. La prueba para el VPH puede usarse para Development worker, community a mujeres de ms de 30 aos y debe usarse en mujeres de cualquier edad Cisco del papanicolau no sean claros. Despus de los 30 aos, las mujeres deben hacerse el anlisis para el VPH con la misma frecuencia que el papanicolau.  El cncer colorectal puede detectarse y con fecuencia puede  prevenirse. La mayor parte de los estudios de rutina comienzan a los 50 aos y [place] 75 aos. Sin embargo, el mdico podr aconsejarle que lo haga antes, si tiene factores de riesgo para el cncer de colon. Una vez por ao, el profesional le dar un kit de prueba para Recruitment consultant oculta en la materia fecal. Ladonna Snide un tubo con Ladon Applebaum cmara en su extremo para examinar directamente el colon (sigmoidoscopa o colonoscopa), para detectar formas temprana de cncer colorectal. Hable con su mdico si tiene 50 aos, cuando comience con los estudios de [place]. El examen directo del colon debe repetirse cada 5 a 10 aos, hasta los 75 aos, excepto que se encuentren formas tempranas de plipos precancerosos o pequeos bultos.  Se recomienda realizar un anlisis de sangre para Engineer, manufacturing hepatitis C a todas las personas [address] 1944 y 1964, y a todo aquel que tenga un riesgo conocido de haber contrado esta enfermedad.  Practique el sexo seguro. Use condones y evite las prcticas sexuales riesgosas para disminuir el contagio de enfermedades de transmisin sexual. Las enfermedades de transmisin sexual son la gonorrea, clamidia, sfilis, tricomonas, herpes, VPH y el virus de inmunodeficiencia humana (VIH). El herpes, el VIH y [place] VPH son enfermedades virales que no tienen [place]. Pueden producir discapacidad, cncer y [employer]. Las mujeres sexualmente activas de 25 aos o menos deben ser evaluadas para Engineer, manufacturing clamidia. Las Coca Cola que tengan mltiples compaeros tambin deben hacerse el anlisis para Engineer, manufacturing clamidia. Se recomienda realizar anlisis para detectar otras enfermedades de transmisin sexual si es sexualmente [place] y tiene riesgos.  La osteoporosis es una enfermedad en la que los huesos pierden los minerales y la fuerza por el avance de la edad. El resultado pueden ser fracturas graves en los [place]. El riesgo de osteoporosis puede identificarse con Neomia Dear prueba de  densidad sea. Las mujeres de ms de 65 aos y las que tengan riesgos de sufrir fracturas u osteoporosis deben pedir consejo a su mdico. Consulte a su mdico si debe tomar un suplemento de calcio o de vitamina D para reducir el riesgo de osteoporosis.  La menopausia se asocia a sntomas y riesgos fsicos. Se dispone de una terapia de reemplazo hormonal para disminuir los sntomas y [place]. Consulte a su mdico para saber si la terapia de reemplazo hormonal es conveniente para usted.  Use una pantalla solar con un factor SPF de 30 o mayor. Aplique pantalla de Pietro Cassis y repetida a lo largo del Futures trader. Pngase al resguardo del sol cuando la sombra sea ms pequea que usted. Protjase usando mangas y [place], un sombrero de ala ancha y gafas para el sol todo [place],  siempre que se encuentre en el exterior.  Una vez por mes hgase un examen de la piel de todo el cuerpo usando un espejo para ver la espalda. nforme al mdico si aparecen nuevos lunares, los que ya estn tienen bordes [address], los que sean ms grandes que una goma de lpiz o los que hayan cambiado su forma o color.  Mantngase al da con las vacunas.  [place]ripe: Debe aplicarse una dosis todos en cada otoo (o invierno). La composicin de la vacuna de la gripe cambia todos los [place], por lo tanto no es suficiente con vacunarse una vez.  Vacuna antineumocccica de polisacridos Debe aplicarse 1  2 dosis si fuma o si sufre ciertas enfermedades crnicas. Necesitar 1 dosis a los 65 aos (o ms) si nunca se ha vacunado.  Vacuna difteria, ttanos, tos convulsa (DTP). Aplquese una dosis de la vacuna DTaP (vacuna contra la tos convulsa para adultos) si es menor de 65 aos, si tiene ms de 65 aos y est en contacto con un beb, es un trabajador de la [place], es una mujer embarazada o simplemente quiere estar protegido de la enfermedad. Luego necesitar un refuerzo de DT cada 10 aos. Consulte con su mdico si no ha recibido al menos  3 dosis de la vacuna contra el ttanos (y la difteria) en algn momento de su vida o tiene una herida profunda o sucia.  VPH: Debe aplicarse esta vacuna si tiene 26 aos o menos. La vacuna se administra en 3 dosis, generalmente durante el curso de 6 meses.  MMR (sarampin, paperas, rubola) Debe aplicarse al menos una dosis de MMR si ha nacido en 1957 o despus. Podra tambin necesitar una segunda dosis.  Antimeningocccica Si tiene entre 19 y 65 aos y es un estudiante universitario de Therapist, occupational que vive en una residencia estudiantil, o tiene alguna enfermedad mdica, debe recibir esta vacuna. Podra tambin necesitar dosis de refuerzo.  Herpes zoster (culebrilla). Si tiene 60 aos o ms debe aplicarse esta vacuna ahora.  Varicela Si nunca se vacun o slo recibi una dosis, hable con su mdico para averiguar si necesita aplicarse esta vacuna.  Hepatitis A. Debe aplicarse esta vacuna si tiene un factor de riesgo especfico para contraer una infeccin por el virus de la hepatitis A, o simplemente desea estar protegido contra la enfermedad. La vacuna se administra en 2 dosis, con una diferencia entre 6 y 18 meses.  Hepatitis B. Debe aplicarse esta vacuna si tiene un factor de riesgo especfico para contraer una infeccin por el virus de la hepatitis B, o simplemente desea estar protegido contra la enfermedad. La vacuna se administra en 3 dosis, generalmente durante el curso de 6 meses. Controles preventivos - Frecuencia Edad 19 a 39  Control de la presin arterial.** / Cada 1 a 2 aos.  Control de lpidos y colesterol.** / Cada 5 aos, comenzando a los 20 aos.  Examen clnico de mamas.** / Cada 3 aos en las [employer] 20 y los [address] [place].  Papanicolau.** / Cada 2 aos [employer] 21 y los [place]. Despus de los [address] [place], y [place] 65 o 70, con una historia de 3 papanicolau normales consecutivos.  Pruebas para el VPH.** / Cada 3 aos, a partir de los [address] [place], y [place] 65 o 70, con  una historia de 3 papanicolau normales consecutivos.  Anlisis de sangre para la hepatitis C. ** / Para todo individuo con riesgos conocidos para la hepatitis C.  Autoexamen de piel. /  [employer].  Vacuna contra la gripe.** / [employer].  Vacuna antineumocccica de polisacridos.** / Debe aplicarse 1  2 dosis si fuma o si sufre ciertas enfermedades crnicas.  Vacuna difteria, ttanos, tos convulsa (Tdap, Td). / Una dosis nica de vacuna Tdap. Luego necesitar un refuerzo de DT cada 10 aos.  Vacuna Printmaker. / 3 dosis en el curso de 6 meses, si tiene 26 aos o menos.  MMR (sarampin, paperas, rubola). / Debe aplicarse al menos una dosis de MMR si ha nacido en 1957 o despus. Podra tambin necesitar una segunda dosis.  Vacunacin antimeningocccica. / Si tiene entre 41 y 10 aos y es un estudiante universitario de Therapist, occupational que vive en una residencia estudiantil, o tiene alguna enfermedad mdica, debe recibir esta vacuna. Podra tambin necesitar dosis de refuerzo.  Vacuna contra la varicela.** / Consltelo con el mdico.  Vacuna contra la hepatitis A.** / Consltelo con el mdico. 2 dosis, con un intervalo entre 6 a 18 meses.  Vacuna contra la hepatitis B.** / Consltelo con el mdico. 3 dosis en el curso de 6 meses. Edad 40 a 64  Control de la presin arterial.** / Cada 1 a 2 aos.  Control de lpidos y colesterol. **/ Cada 5 aos, comenzando a los 20 aos.  Examen clnico de mamas.** / Todos los aos despus de los 40 aos.  Mamografa.** / Neomia Dear vez por ao a partir de los 40 aos y continuando siempre que tenga buena salud. Consulte con el mdico.  Papanicolau.** / Cada 3 aos despus de los [address] [place], y [place] 65 o 70, con una historia de 3 papanicolau normales consecutivos.  Pruebas para el VPH.** / Cada 3 aos despus de los [address] [place], y [place] 65 o 70, con una historia de 3 papanicolau normales consecutivos.  Prueba de sangre oculta en materia fecal. /  Cada ao comenzando a los 50 aos continuando [employer] 75. No tendr que hacerlo si se ha hecho una colonoscopa cada 10 aos.  Sigmoidoscopa flexible** o colonoscopa.** / Cada 5 aos para la sigmoidoscopa flexible o cada 10 aos para la colonoscopa, comenzando a los 50 aos y continuando [employer] 75 aos.  Anlisis de sangre para la hepatitis C. ** / Para todas las personas [address] 1944 y 81 y a todo aquel que tenga un riesgo conocido para la hepatitis C.  Autoexamen de piel. / Todos los [place].  Vacuna contra la gripe.** / [employer].  Vacuna antineumocccica de polisacridos.** / Debe aplicarse 1  2 dosis si fuma o si sufre ciertas enfermedades crnicas.  Vacuna difteria, ttanos, tos convulsa (Tdap, Td). / Una dosis nica de vacuna Tdap. Luego necesitar un refuerzo de DT cada 10 aos.  MMR (sarampin, paperas, rubola). / Debe aplicarse al menos una dosis de MMR si ha nacido en 1957 o despus. Podra tambin necesitar una segunda dosis.  Vacuna contra la varicela.**/ Consltelo con el mdico.  Vacunacin antimeningocccica. / Consltelo con el mdico.  Vacuna contra la hepatitis A.**/ Consltelo con el mdico. 2 dosis, con un intervalo entre 6 a 18 meses.  Vacuna contra la hepatitis B.** / Consltelo con el mdico. 3 dosis en el curso de 6 meses. Edad 65 o ms  Control de la presin arterial.** / Cada 1 a 2 aos.  Control de lpidos y colesterol. **/ Cada 5 aos, comenzando a los 20 aos.  Examen clnico de mamas.** / Todos los aos despus de los 40  aos.  Ileene Musa.** / Neomia Dear vez por ao a partir de los 40 aos y continuando siempre que tenga buena salud. Consulte con el mdico.  Papanicolau. ** / Cada 3 aos despus de los [address] [place], y [place] 65 o 70, con una historia de 3 papanicolau normales consecutivos. Las pruebas pueden [employer] 65 y los 70 aos, si tiene 3 papanicolau consecutivos normales y no tuvo un papanicoalu ni prueba de VPH [employer] ltimos 10 aos.  Pruebas para el VPH.** / Cada 3 aos despus de los [address], y [place] 65 o 70, con una historia de 3 papanicolau normales consecutivos. Las pruebas pueden [employer] 65 y los 70 aos, si tiene 3 papanicolau consecutivos normales y no tuvo un papanicoalu ni prueba de VPH Apple Computer ltimos 10 aos.  Prueba de sangre oculta en materia fecal. / Cada ao comenzando a los 50 aos continuando [employer] 75. No tendr que hacerlo si se ha hecho una colonoscopa cada 10 aos.  Sigmoidoscopa flexible** o colonoscopa.** / Cada 5 aos para la sigmoidoscopa flexible o cada 10 aos para la colonoscopa, comenzando a los 50 aos y continuando [employer] 75 aos.  Anlisis de sangre para la hepatitis C. ** / Para todas las personas [address] 1944 y 70 y a todo aquel que tenga un riesgo conocido para la hepatitis C.  Pruebas para la osteoporosis.** / Por nica vez en mujeres de ms de 65 aos que tengan riesgo de fracturas u osteoporosis.  Autoexamen de piel. / Todos los [place].  Vacuna contra la gripe.** / [employer].  Vacuna antineumocccica de polisacridos.** / Necesitar 1 dosis a los 65 aos (o ms) si nunca se ha vacunado.  Vacuna difteria, ttanos, tos convulsa (Tdap, Td). / Aplquese una dosis de la vacuna DTaP (vacuna contra la tos convulsa para adultos) si tiene ms de 65 aos y est en contacto con un beb, es un trabajador de [address], es una mujer embarazada o simplemente quiere estar protegido de la enfermedad. Luego necesitar un refuerzo de DT cada 10 aos.  Vacuna contra la varicela.**/ Consltelo con el mdico.  Vacunacin antimeningocccica.** / Consltelo con el mdico.  Vacuna contra la hepatitis A.** / Consltelo con el mdico. 2 dosis, con un intervalo entre 6 a 18 meses.  Vacuna contra la hepatitis B.** / Consulte con el mdico. 3 dosis en el curso de 6 meses. **La historia familiar y personal de riesgos y enfermedades puede  cambiar las recomendaciones del mdico. Document Released: 12/30/2004 Document Revised: 06/14/2011 ExitCare Patient Information 2014 [place], [place].

## 2013-01-17 NOTE — Progress Notes (Signed)
  Subjective:     Crystal Fleming is a 37 y.o. female G3P3 who is here for a comprehensive physical exam. The patient reports no problems.  Patient is sexually active using OCP for contraception  History   Social History  . Marital Status: Single    Spouse Name: N/A    Number of Children: N/A  . Years of Education: N/A   Occupational History  . Not on file.   Social History Main Topics  . Smoking status: Not on file  . Smokeless tobacco: Not on file  . Alcohol Use: Not on file  . Drug Use: Not on file  . Sexual Activity: Not on file   Other Topics Concern  . Not on file   Social History Narrative  . No narrative on file   Health Maintenance  Topic Date Due  . Pap Smear  11/07/1994  . Tetanus/tdap  11/07/1995  . Influenza Vaccine  11/03/2012   No past medical history on file. No past surgical history on file. No family history on file. History  Substance Use Topics  . Smoking status: Not on file  . Smokeless tobacco: Not on file  . Alcohol Use: Not on file        Review of Systems A comprehensive review of systems was negative.   Objective:      GENERAL: Well-developed, well-nourished female in no acute distress.  HEENT: Normocephalic, atraumatic. Sclerae anicteric.  NECK: Supple. Normal thyroid.  LUNGS: Clear to auscultation bilaterally.  HEART: Regular rate and rhythm. BREASTS: Symmetric in size. No palpable masses or lymphadenopathy, skin changes, or nipple drainage. ABDOMEN: Soft, nontender, nondistended. No organomegaly. PELVIC: Normal external female genitalia. Vagina is pink and rugated.  Normal discharge. Normal appearing cervix. Uterus is normal in size. No adnexal mass or tenderness. EXTREMITIES: No cyanosis, clubbing, or edema, 2+ distal pulses.    Assessment:    Healthy female exam.      Plan:    pap smear collected Patient advised to perform monthly self breast exam Patient will be contacted with any abnormal results See  After Visit Summary for Counseling Recommendations

## 2013-01-17 NOTE — Addendum Note (Signed)
Addended by: Jill Side on: 01/17/2013 02:46 PM   Modules accepted: Orders

## 2013-01-20 LAB — URINE CULTURE

## 2013-01-22 ENCOUNTER — Telehealth: Payer: Self-pay | Admitting: *Deleted

## 2013-01-22 MED ORDER — CIPROFLOXACIN HCL 500 MG PO TABS: 500.0000 mg | ORAL_TABLET | Freq: Two times a day (BID) | ORAL | Status: DC

## 2013-01-22 NOTE — Telephone Encounter (Addendum)
Message copied by Jill Side on Mon Jan 22, 2013  2:05 PM ------      Message from: DAY, [place] L      Created: Wed Jan 17, 2013  2:27 PM                   ----- Message -----         From: Catalina Antigua, MD         Sent: 01/17/2013   2:08 PM           To: [facility]            Please inform patient of positive UTI. Rx e-prescribed            Peggy  ------ Consult with Dr. Jolayne Panther regarding Rx for Macrobid- pt does not have insurance and medication will cost in excess of $40.  Rx changed to Cipro. Called pt with Joelene Millin and informed pt of UTI requiring medication.  Pt voiced understanding and will obtain medication.    Diane Day RNC

## 2013-10-01 ENCOUNTER — Ambulatory Visit: Payer: Self-pay | Admitting: Internal Medicine

## 2013-11-08 ENCOUNTER — Ambulatory Visit: Payer: Self-pay | Attending: Internal Medicine | Admitting: Internal Medicine

## 2013-11-08 ENCOUNTER — Encounter: Payer: Self-pay | Admitting: Internal Medicine

## 2013-11-08 VITALS — BP 135/83 | HR 80 | Temp 98.6°F | Resp 16 | Ht 62.21 in | Wt 156.0 lb

## 2013-11-08 DIAGNOSIS — N939 Abnormal uterine and vaginal bleeding, unspecified: Secondary | ICD-10-CM | POA: Insufficient documentation

## 2013-11-08 DIAGNOSIS — E039 Hypothyroidism, unspecified: Secondary | ICD-10-CM | POA: Insufficient documentation

## 2013-11-08 DIAGNOSIS — N926 Irregular menstruation, unspecified: Secondary | ICD-10-CM | POA: Insufficient documentation

## 2013-11-08 DIAGNOSIS — Z01419 Encounter for gynecological examination (general) (routine) without abnormal findings: Secondary | ICD-10-CM | POA: Insufficient documentation

## 2013-11-08 LAB — CBC
HCT: 38.5 % (ref 36.0–46.0)
Hemoglobin: 13.8 g/dL (ref 12.0–15.0)
MCH: 31.6 pg (ref 26.0–34.0)
MCHC: 35.8 g/dL (ref 30.0–36.0)
MCV: 88.1 fL (ref 78.0–100.0)
Platelets: 250 10*3/uL (ref 150–400)
RBC: 4.37 MIL/uL (ref 3.87–5.11)
RDW: 13 % (ref 11.5–15.5)
WBC: 7.8 10*3/uL (ref 4.0–10.5)

## 2013-11-08 LAB — POCT URINE PREGNANCY: Preg Test, Ur: NEGATIVE

## 2013-11-08 MED ORDER — NORGESTIMATE-ETH ESTRADIOL 0.25-35 MG-MCG PO TABS: 1.0000 | ORAL_TABLET | Freq: Every day | ORAL | Status: DC

## 2013-11-08 NOTE — Progress Notes (Signed)
Patient ID: Crystal Fleming, female   DOB: 05/20/1975, 38 y.o.   MRN: [id number]  CC: right sided abdominal pain and irregular menstrual cycles"  HPI: Mrs. Fleming presents today for a follow up for right sided abdominal pain and irregular menses. She reports that August the fourth she started menstruating after having a menstrual period on 10-22-13. She reports that she has been having two menstrual cycles with abdominal pain for years but did not give exact number of years. The flow is reported as average and varying in color from brown to red. She states that she is not a smoker, and recently ran out of oral birth control pills.  Allergies  Allergen Reactions  . Penicillins    Past Medical History  Diagnosis Date  . Hypothyroid 2010   Current Outpatient Prescriptions on File Prior to Visit  Medication Sig Dispense Refill  . ciprofloxacin (CIPRO) 500 MG tablet Take 1 tablet (500 mg total) by mouth 2 (two) times daily.  6 tablet  0  . levothyroxine (SYNTHROID) 100 MCG tablet Take 1 tablet (100 mcg total) by mouth daily.  30 tablet  11  . norgestimate-ethinyl estradiol (ORTHO-CYCLEN,SPRINTEC,PREVIFEM) 0.25-35 MG-MCG tablet Take 1 tablet by mouth daily.  1 Package  11  . Norgestimate-Ethinyl Estradiol Triphasic (ORTHO TRI-CYCLEN, 28,) 0.18/0.215/0.25 MG-35 MCG tablet Take 1 tablet by mouth daily.  1 Package  11  . senna-docusate (SENOKOT S) 8.6-50 MG per tablet Take 1 tablet by mouth at bedtime. May take 2 tabs at night if still constipated  60 tablet  10   No current facility-administered medications on file prior to visit.   History reviewed. No pertinent family history. History   Social History  . Marital Status: Single    Spouse Name: N/A    Number of Children: N/A  . Years of Education: N/A   Occupational History  . Not on file.   Social History Main Topics  . Smoking status: Never Smoker   . Smokeless tobacco: Not on file  . Alcohol Use: No  . Drug Use: No   . Sexual Activity: Not Currently    Birth Control/ Protection: OCP   Other Topics Concern  . Not on file   Social History Narrative  . No narrative on file    Review of Systems  Constitutional: Negative.   HENT: Negative.   Genitourinary: Negative.   Skin: Negative.      Objective:   Filed Vitals:   11/08/13 1721  BP: 135/83  Pulse: 80  Temp: 98.6 F (37 C)  Resp: 16    Physical Exam  Constitutional: She is oriented to person, place, and time. Vital signs are normal. She appears well-developed and well-nourished.  HENT:  Head: Normocephalic.  Eyes: Conjunctivae are normal.  Neck: No thyromegaly present.  Cardiovascular: Normal rate, regular rhythm, S1 normal, S2 normal and normal heart sounds.   Pulmonary/Chest: Effort normal and breath sounds normal.  Abdominal: Soft. Bowel sounds are normal.  Neurological: She is alert and oriented to person, place, and time. She has normal strength. No cranial nerve deficit.  Skin: Skin is warm, dry and intact. No rash noted. No cyanosis. Nails show no clubbing.  Psychiatric: She has a normal mood and affect. Her speech is normal and behavior is normal. Judgment and thought content normal. Cognition and memory are normal.     Lab Results  Component Value Date   WBC 5.6 10/25/2012   HGB 14.2 10/25/2012   HCT 40.4 10/25/2012   MCV 91.4  10/25/2012   PLT 281 10/25/2012   Lab Results  Component Value Date   CREATININE 0.58 10/25/2012   BUN 9 10/25/2012   NA 138 10/25/2012   K 4.3 10/25/2012   CL 100 10/25/2012   CO2 29 10/25/2012    No results found for this basename: HGBA1C   Lipid Panel     Component Value Date/Time   CHOL 211* 10/25/2012 1103   TRIG 207* 10/25/2012 1103   HDL 49 10/25/2012 1103   CHOLHDL 4.3 10/25/2012 1103   VLDL 41* 10/25/2012 1103   LDLCALC 121* 10/25/2012 1103       Assessment and plan:   Shreeya was seen today for follow-up and hypothyroidism.  Diagnoses and associated orders for this  visit:  Unspecified hypothyroidism - TSH - T4, Free  Abnormal uterine bleeding - POCT urine pregnancy - Ambulatory referral to Gynecology - CBC  Routine gynecological examination - norgestimate-ethinyl estradiol (ORTHO-CYCLEN,SPRINTEC,PREVIFEM) 0.25-35 MG-MCG tablet; Take 1 tablet by mouth daily.   Return in about 6 months (around 05/11/2014) for hypothyroidism.        Holland Commons, NP-C [facility] [phone] 11/08/2013, 5:53 PM

## 2013-11-08 NOTE — Progress Notes (Signed)
Patient presents for f/u on hypothyroidism Requesting med refills C/O 1 week history of "right ovary" pain Also c/o vaginal bleeding for last 2 days even though LMP 10/22/13  States ran out of OBC 1 month ago after being on for 9 years

## 2013-11-09 LAB — TSH: TSH: 2.894 u[IU]/mL (ref 0.350–4.500)

## 2013-11-09 LAB — T4, FREE: FREE T4: 1.04 ng/dL (ref 0.80–1.80)

## 2013-11-19 ENCOUNTER — Telehealth: Payer: Self-pay | Admitting: Internal Medicine

## 2013-11-19 DIAGNOSIS — E039 Hypothyroidism, unspecified: Secondary | ICD-10-CM

## 2013-11-19 NOTE — Telephone Encounter (Signed)
Pt. Is calling in regards to lab work performed on 11/08/13...the patient will need spanish interpreter.Marland Kitchen..Marland Kitchen

## 2013-11-22 MED ORDER — LEVOTHYROXINE SODIUM 100 MCG PO TABS: 100.0000 ug | ORAL_TABLET | Freq: Every day | ORAL | Status: DC

## 2013-11-22 NOTE — Telephone Encounter (Signed)
Patient notified via in-house interpreter that all labs are normal and that she should continue on same dosage of synthroid. Synthroid 100 mcg  #30 with 2 refillse-scribed [facility] [address] Patient aware that referral has been made for GYN

## 2013-11-22 NOTE — Telephone Encounter (Signed)
Message copied by Fredderick SeveranceUCATTE, Fernandez Kenley L on Thu Nov 22, 2013 12:20 PM ------      Message from: Holland CommonsKECK, VALERIE A      Created: Fri Nov 09, 2013  6:01 PM       Thyroid function is normal we will keep with the same dose of Synthroid. Please send refill for Synthroid for patient ------

## 2013-12-19 ENCOUNTER — Ambulatory Visit (INDEPENDENT_AMBULATORY_CARE_PROVIDER_SITE_OTHER): Payer: Self-pay | Admitting: Obstetrics and Gynecology

## 2013-12-19 ENCOUNTER — Other Ambulatory Visit (HOSPITAL_COMMUNITY)
Admission: RE | Admit: 2013-12-19 | Discharge: 2013-12-19 | Disposition: A | Discharge status: Home / Self Care | Payer: Self-pay | Source: Ambulatory Visit | Attending: Obstetrics and Gynecology | Admitting: Obstetrics and Gynecology

## 2013-12-19 ENCOUNTER — Encounter: Payer: Self-pay | Admitting: Obstetrics and Gynecology

## 2013-12-19 VITALS — BP 130/81 | HR 75 | Temp 99.3°F | Ht 61.0 in | Wt 156.2 lb

## 2013-12-19 DIAGNOSIS — N926 Irregular menstruation, unspecified: Secondary | ICD-10-CM

## 2013-12-19 DIAGNOSIS — Z01812 Encounter for preprocedural laboratory examination: Secondary | ICD-10-CM

## 2013-12-19 DIAGNOSIS — N939 Abnormal uterine and vaginal bleeding, unspecified: Secondary | ICD-10-CM | POA: Insufficient documentation

## 2013-12-19 DIAGNOSIS — R87619 Unspecified abnormal cytological findings in specimens from cervix uteri: Secondary | ICD-10-CM | POA: Insufficient documentation

## 2013-12-19 LAB — POCT PREGNANCY, URINE: PREG TEST UR: NEGATIVE

## 2013-12-19 NOTE — Progress Notes (Signed)
Patient ID: Crystal Fleming, female   DOB: December 12, 1975, 38 y.o.   MRN: [id number] 38 yo G3P3 with BMI 29 presenting today for the evaluation of abnormal uterine bleeding. Patient reports having abnormal bleeding pattern for several years which was controlled with OCP. She did not refill her OCP and has noted experiencing 2 periods per month lasting 4-5 days each over the past 5-6 months. Patient reports bleeding is associated with some dysmenorrhea  Past Medical History  Diagnosis Date  . Hypothyroid 2010   No past surgical history on file. No family history on file. History  Substance Use Topics  . Smoking status: Never Smoker   . Smokeless tobacco: Not on file  . Alcohol Use: No   GENERAL: Well-developed, well-nourished female in no acute distress.  ABDOMEN: Soft, nontender, nondistended. No organomegaly. PELVIC: Normal external female genitalia. Vagina is pink and rugated.  Normal discharge. Normal appearing cervix. Uterus is normal in size. No adnexal mass or tenderness. EXTREMITIES: No cyanosis, clubbing, or edema, 2+ distal pulses.  A/P 38 yo G3P3 with DUB - Discussed the need for an endometrial biopsy ENDOMETRIAL BIOPSY     The indications for endometrial biopsy were reviewed.   Risks of the biopsy including cramping, bleeding, infection, uterine perforation, inadequate specimen and need for additional procedures  were discussed. The patient states she understands and agrees to undergo procedure today. Consent was signed. Time out was performed. Urine HCG was negative. A sterile speculum was placed in the patient's vagina and the cervix was prepped with Betadine. A single-toothed tenaculum was placed on the anterior lip of the cervix to stabilize it. The uterine cavity was sounded to a depth of 9 cm using the uterine sound. The 3 mm pipelle was introduced into the endometrial cavity without difficulty, 2 passes were made.  A  moderate amount of tissue was  sent to pathology. The  instruments were removed from the patient's vagina. Minimal bleeding from the cervix was noted. The patient tolerated the procedure well.  Routine post-procedure instructions were given to the patient. The patient will follow up in two weeks to review the results and for further management.   - Discussed medical management of DUB but patient is uncertain right now

## 2013-12-19 NOTE — Progress Notes (Signed)
Patient reports having two periods per month for the past 5 months. She bleeds for 3-4 days.

## 2014-01-09 ENCOUNTER — Encounter: Payer: Self-pay | Admitting: Obstetrics and Gynecology

## 2014-01-09 ENCOUNTER — Ambulatory Visit (INDEPENDENT_AMBULATORY_CARE_PROVIDER_SITE_OTHER): Payer: Self-pay | Admitting: Obstetrics and Gynecology

## 2014-01-09 VITALS — BP 123/82 | HR 79 | Temp 98.3°F | Resp 20 | Ht 62.0 in | Wt 158.1 lb

## 2014-01-09 DIAGNOSIS — Z712 Person consulting for explanation of examination or test findings: Secondary | ICD-10-CM

## 2014-01-09 DIAGNOSIS — Z7189 Other specified counseling: Secondary | ICD-10-CM

## 2014-01-09 NOTE — Progress Notes (Signed)
Patient ID: [name], female   DOB: 06/27/1975, 38 y.o.   MRN: [id number] 38 yo with DUB presenting today to discuss results of her endometrial biopsy and further management of her DUB. Patient has been taking OCP but has not taken them for the past 8 days due to issues with her pharmacy.   Endometrium, biopsy - SECRETORY ENDOMETRIUM. - NO HYPERPLASIA OR MALIGNANCY.  A/P 38 yo with DUB - results of endometrial biopsy reviewed and explained to the patient - Advised to continue OCP daily. Informed patient to expect irregular bleeding with first pack - advised patient to use condoms for the first 3 weeks - RTC in 6 months for follow up - Patient voiced a desire for permanent sterilization. Will have patient speak with Burman FosterReyna in financial department. If patient wishes to pursue this option, she is to contact our clinic to inform us of her decision in order to be scheduled. Risks, benefits and alternatives were reviewed and explained to the patient. If patient wishes to proceed, she will be scheduled for a laparoscopic bilateral salpingectomy

## 2014-01-16 ENCOUNTER — Other Ambulatory Visit: Payer: Self-pay | Admitting: Internal Medicine

## 2014-02-04 ENCOUNTER — Encounter: Payer: Self-pay | Admitting: Obstetrics and Gynecology

## 2014-03-04 ENCOUNTER — Other Ambulatory Visit: Payer: Self-pay | Admitting: Internal Medicine

## 2014-03-08 ENCOUNTER — Telehealth: Payer: Self-pay | Admitting: Internal Medicine

## 2014-03-08 NOTE — Telephone Encounter (Signed)
Pt requesting refill for thyroid medications, pt is due for f/u in February. Pt uses [facility] [address], please f/u with pt.

## 2014-03-14 ENCOUNTER — Other Ambulatory Visit: Payer: Self-pay | Admitting: Emergency Medicine

## 2014-03-14 ENCOUNTER — Telehealth: Payer: Self-pay | Admitting: Internal Medicine

## 2014-03-14 DIAGNOSIS — E039 Hypothyroidism, unspecified: Secondary | ICD-10-CM

## 2014-03-14 MED ORDER — LEVOTHYROXINE SODIUM 100 MCG PO TABS: 100.0000 ug | ORAL_TABLET | Freq: Every day | ORAL | Status: DC

## 2014-03-14 NOTE — Telephone Encounter (Signed)
Pt. Is calling to request a medication refill for levothyroxine (SYNTHROID) 100 MCG tablet , please f/u with pt.

## 2014-03-14 NOTE — Telephone Encounter (Signed)
Medication reordered and e-scribed to [facility] 

## 2014-03-22 ENCOUNTER — Encounter: Payer: Self-pay | Admitting: *Deleted

## 2014-03-27 ENCOUNTER — Ambulatory Visit: Payer: Self-pay | Attending: Internal Medicine

## 2014-04-10 ENCOUNTER — Encounter: Payer: Self-pay | Admitting: Internal Medicine

## 2014-04-10 ENCOUNTER — Ambulatory Visit: Payer: Self-pay | Attending: Internal Medicine | Admitting: Internal Medicine

## 2014-04-10 ENCOUNTER — Ambulatory Visit (HOSPITAL_BASED_OUTPATIENT_CLINIC_OR_DEPARTMENT_OTHER): Payer: Self-pay | Admitting: *Deleted

## 2014-04-10 VITALS — BP 123/76 | HR 68 | Temp 98.0°F | Resp 16 | Ht 62.0 in | Wt 158.0 lb

## 2014-04-10 DIAGNOSIS — Z23 Encounter for immunization: Secondary | ICD-10-CM

## 2014-04-10 DIAGNOSIS — Z79899 Other long term (current) drug therapy: Secondary | ICD-10-CM | POA: Insufficient documentation

## 2014-04-10 DIAGNOSIS — Z880 Allergy status to penicillin: Secondary | ICD-10-CM | POA: Insufficient documentation

## 2014-04-10 DIAGNOSIS — E039 Hypothyroidism, unspecified: Secondary | ICD-10-CM

## 2014-04-10 DIAGNOSIS — K5900 Constipation, unspecified: Secondary | ICD-10-CM

## 2014-04-10 LAB — TSH: TSH: 3.771 u[IU]/mL (ref 0.350–4.500)

## 2014-04-10 LAB — T4, FREE: FREE T4: 1.06 ng/dL (ref 0.80–1.80)

## 2014-04-10 NOTE — Progress Notes (Signed)
Patient ID: Crystal Fleming, female   DOB: [birth year], 39 y.o.   MRN: [id number]  CC: thyroid disease and fatigue HPI: Crystal Fleming is a 39 y.o. female here today for a follow up visit.  Patient has past medical history of hypothyroidism. Patient reports that her feet and legs feel tired.  She reports that she usually exercises daily but she has been having more difficulty with fatigue lately.  She has noticed some fluctuation in weight and constipation. She admits to feelings of nervousness.     Patient has No headache, No chest pain, No abdominal pain - No Nausea, No new weakness tingling or numbness, No Cough - SOB.  Allergies  Allergen Reactions  . Penicillins    Past Medical History  Diagnosis Date  . Hypothyroid 2010   Current Outpatient Prescriptions on File Prior to Visit  Medication Sig Dispense Refill  . levothyroxine (SYNTHROID) 100 MCG tablet Take 1 tablet (100 mcg total) by mouth daily. 30 tablet 2  . norgestimate-ethinyl estradiol (ORTHO-CYCLEN,SPRINTEC,PREVIFEM) 0.25-35 MG-MCG tablet Take 1 tablet by mouth daily. 1 Package 11  . senna-docusate (SENOKOT S) 8.6-50 MG per tablet Take 1 tablet by mouth at bedtime. May take 2 tabs at night if still constipated (Patient not taking: Reported on 04/10/2014) 60 tablet 10   No current facility-administered medications on file prior to visit.   History reviewed. No pertinent family history. History   Social History  . Marital Status: Single    Spouse Name: N/A    Number of Children: N/A  . Years of Education: N/A   Occupational History  . Not on file.   Social History Main Topics  . Smoking status: Never Smoker   . Smokeless tobacco: Not on file  . Alcohol Use: No  . Drug Use: No  . Sexual Activity: Not Currently    Birth Control/ Protection: OCP   Other Topics Concern  . Not on file   Social History Narrative    Review of Systems: See HPI   Objective:   Filed Vitals:   04/10/14 1037   BP: 123/76  Pulse: 68  Temp: 98 F (36.7 C)  Resp: 16    Physical Exam  Constitutional: She is oriented to person, place, and time.  Neck: Normal range of motion. No thyromegaly present.  Cardiovascular: Normal rate, regular rhythm and normal heart sounds.   Pulmonary/Chest: Effort normal and breath sounds normal.  Lymphadenopathy:    She has no cervical adenopathy.  Neurological: She is alert and oriented to person, place, and time.  Skin: Skin is warm and dry.     Lab Results  Component Value Date   WBC 7.8 11/08/2013   HGB 13.8 11/08/2013   HCT 38.5 11/08/2013   MCV 88.1 11/08/2013   PLT 250 11/08/2013   Lab Results  Component Value Date   CREATININE 0.58 10/25/2012   BUN 9 10/25/2012   NA 138 10/25/2012   K 4.3 10/25/2012   CL 100 10/25/2012   CO2 29 10/25/2012    No results found for: HGBA1C Lipid Panel     Component Value Date/Time   CHOL 211* 10/25/2012 1103   TRIG 207* 10/25/2012 1103   HDL 49 10/25/2012 1103   CHOLHDL 4.3 10/25/2012 1103   VLDL 41* 10/25/2012 1103   LDLCALC 121* 10/25/2012 1103       Assessment and plan:   Crystal Fleming was seen today for follow-up.  Diagnoses and associated orders for this visit:  Hypothyroidism, unspecified hypothyroidism type - TSH -  T4, Free - Vitamin D, 25-hydroxy  Constipation, unspecified constipation type Advised to try Miralax and colace daily  Need for influenza vaccination Influenza injection received.  Explained side effects and contraindications to patient. Information sheet given to patient.  Will call with TSH results.  Return in about 6 months (around 10/09/2014) for hypothyroidism.   Holland CommonsKECK, Deyana Wnuk, NP-C [facility] [phone] 04/10/2014, 10:51 AM

## 2014-04-10 NOTE — Progress Notes (Signed)
Pt is here following up on her thyroid disease.  Pt states that her legs and feet feel sore and tired. Pt has an interpreter.

## 2014-04-10 NOTE — Patient Instructions (Signed)
Estreimiento (Constipation) Estreimiento significa que una persona tiene menos de tres evacuaciones en una semana, dificultad para defecar, o que las heces son secas, duras, o ms grandes que lo normal. A medida que envejecemos el estreimiento es ms comn. Si intenta curar el estreimiento con medicamentos que producen la evacuacin de las heces (laxantes), el problema puede empeorar. El uso prolongado de laxantes puede hacer que los msculos del colon se debiliten. Una dieta baja en fibra, no tomar suficientes lquidos y el uso de ciertos medicamentos pueden empeorar el estreimiento.  CAUSAS   Ciertos medicamentos, como los antidepresivos, analgsicos, suplementos de hierro, anticidos y diurticos.  Algunas enfermedades, como la diabetes, el sndrome del colon irritable, enfermedad de la tiroides, o depresin.  No beber suficiente agua.  No consumir suficientes alimentos ricos en fibra.  Situaciones de estrs o viajes.  Falta de actividad fsica o de ejercicio.  Ignorar la necesidad sbita de defecar.  Uso en exceso de laxantes. SIGNOS Y SNTOMAS   Defecar menos de tres veces por semana.  Dificultad para defecar.  Tener las heces secas y duras, o ms grandes que las normales.  Sensacin de estar lleno o hinchado.  Dolor en la parte baja del abdomen.  No sentir alivio despus de defecar. DIAGNSTICO  [name] le har una historia clnica y un examen fsico. Pueden hacerle exmenes adicionales para el estreimiento grave. Estos estudios pueden ser:  Un radiografa con enema de bario para examinar el recto, el colon y, en algunos casos, el intestino delgado.  Una sigmoidoscopia para examinar el colon inferior.  Una colonoscopia para examinar todo el colon. TRATAMIENTO  El tratamiento depender de la gravedad del estreimiento y de la causa. Algunos tratamientos nutricionales son beber ms lquidos y comer ms alimentos ricos en fibra. El cambio en el estilo de vida  incluye hacer ejercicios de manera regular. Si estas recomendaciones para realizar cambios en la dieta y en el estilo de vida no ayudan, el mdico le puede indicar el uso de laxantes de venta libre para ayudarlo a defecar. Los medicamentos recetados se pueden prescribir si los medicamentos de venta libre no lo ayudan.  INSTRUCCIONES PARA EL CUIDADO EN EL HOGAR   Consuma alimentos con alto contenido de fibra, como frutas, vegetales, cereales integrales y porotos.  Limite los alimentos procesados ricos en grasas y azcar, como las papas fritas, hamburguesas, galletas, dulces y refrescos.  Puede agregar un suplemento de fibra a su dieta si no obtiene lo suficiente de los alimentos.  Beba suficiente lquido para mantener la orina clara o de color amarillo plido.  Haga ejercicio regularmente o segn las indicaciones del mdico.  Vaya al bao cuando sienta la necesidad de ir. No se aguante las ganas.  Tome solo medicamentos de venta libre o recetados, segn las indicaciones del mdico. No tome otros medicamentos para el estreimiento sin consultarlo antes con su mdico. SOLICITE ATENCIN MDICA DE INMEDIATO SI:   Observa sangre brillante en las heces.  El estreimiento dura ms de 4 das o empeora.  Siente dolor abdominal o rectal.  Las heces son delgadas como un lpiz.  Pierde peso de manera inexplicable. ASEGRESE DE QUE:   Comprende estas instrucciones.  Controlar su afeccin.  Recibir ayuda de inmediato si no mejora o si empeora. Document Released: 04/11/2007 Document Revised: 03/27/2013 ExitCare Patient Information 2015 [employer]. This information is not intended to replace advice given to you by your health care provider. Make sure you discuss any questions you have with your health   care provider.  

## 2014-04-11 LAB — VITAMIN D 25 HYDROXY (VIT D DEFICIENCY, FRACTURES): VIT D 25 HYDROXY: 13 ng/mL — AB (ref 30–100)

## 2014-04-30 ENCOUNTER — Telehealth: Payer: Self-pay | Admitting: *Deleted

## 2014-04-30 MED ORDER — VITAMIN D (ERGOCALCIFEROL) 1.25 MG (50000 UNIT) PO CAPS: 50000.0000 [IU] | ORAL_CAPSULE | ORAL | Status: DC

## 2014-04-30 NOTE — Telephone Encounter (Signed)
-----   Message from Ambrose FinlandValerie A Keck, NP sent at 04/23/2014 10:59 PM EST ----- TSH is normal, she may stay on current dose. Please refill if needed. Vitamin D is low. Drisdol 50,000 IU to take once weekly for 12 weeks. 12 tables no refills.

## 2014-04-30 NOTE — Telephone Encounter (Signed)
Rx was send to [facility] Pt aware of lab results

## 2014-06-13 ENCOUNTER — Encounter: Payer: Self-pay | Admitting: Internal Medicine

## 2014-06-13 ENCOUNTER — Ambulatory Visit: Payer: Self-pay | Attending: Internal Medicine | Admitting: Internal Medicine

## 2014-06-13 NOTE — Progress Notes (Signed)
Patient ID: Crystal MileMarfelia Fleming, female   DOB: 10/02/1975, 39 y.o.   MRN: [id number] Patient left before being seen. States that she does not want a bill from [facility]

## 2014-06-19 ENCOUNTER — Telehealth: Payer: Self-pay | Admitting: Internal Medicine

## 2014-06-19 NOTE — Telephone Encounter (Signed)
Pt came into clinic requesting medication refill for levothyroxine (SYNTHROID) 100 MCG tablet. Pt is out of medication and is requesting med to be sent to our pharmacy. Please f/u with pt

## 2014-06-24 ENCOUNTER — Telehealth: Payer: Self-pay | Admitting: Internal Medicine

## 2014-06-24 NOTE — Telephone Encounter (Signed)
Pt is requesting a refill for levothyroxine (SYNTHROID) 100 MCG tablet. Pt is out of medications. She is wanting to use our pharmacy because she is hoping to get them to last atleast 2 months [facility]

## 2014-06-28 ENCOUNTER — Telehealth: Payer: Self-pay | Admitting: Internal Medicine

## 2014-06-28 NOTE — Telephone Encounter (Signed)
Pt called requesting medication refill on levothyroxine (SYNTHROID) 100 MCG tablet. Pt states she is completely out and would like to get 90 day supply instead of 30 day. Please f/u with pt

## 2014-06-28 NOTE — Telephone Encounter (Signed)
Patient is out of her levothyroxine (SYNTHROID) 100 MCG tablet. [facility] and they are saying that she has no more refills. Please follow up with pt.

## 2014-06-30 ENCOUNTER — Other Ambulatory Visit: Payer: Self-pay | Admitting: Internal Medicine

## 2014-07-01 ENCOUNTER — Other Ambulatory Visit: Payer: Self-pay | Admitting: Internal Medicine

## 2014-07-01 DIAGNOSIS — E039 Hypothyroidism, unspecified: Secondary | ICD-10-CM

## 2014-07-01 MED ORDER — LEVOTHYROXINE SODIUM 100 MCG PO TABS
100.0000 ug | ORAL_TABLET | Freq: Every day | ORAL | Status: DC
Start: 2014-07-01 — End: 2014-07-10

## 2014-07-01 NOTE — Telephone Encounter (Signed)
[facility]

## 2014-07-01 NOTE — Telephone Encounter (Signed)
Pt called requesting medication refill on levothyroxine (SYNTHROID) 100 MCG tablet. Please f/u with pt

## 2014-07-02 ENCOUNTER — Telehealth: Payer: Self-pay | Admitting: Internal Medicine

## 2014-07-02 NOTE — Telephone Encounter (Signed)
Patient called to request a med refill her Thyroid medication, patient has an upcoming appointment but needs enough medication to last her until then, please f/u with pt.

## 2014-07-08 ENCOUNTER — Ambulatory Visit: Payer: Self-pay | Admitting: Internal Medicine

## 2014-07-10 ENCOUNTER — Ambulatory Visit: Payer: Self-pay | Attending: Internal Medicine | Admitting: Internal Medicine

## 2014-07-10 ENCOUNTER — Encounter: Payer: Self-pay | Admitting: Internal Medicine

## 2014-07-10 VITALS — BP 118/79 | HR 58 | Temp 98.3°F | Resp 16 | Ht 62.0 in | Wt 157.0 lb

## 2014-07-10 DIAGNOSIS — E039 Hypothyroidism, unspecified: Secondary | ICD-10-CM | POA: Insufficient documentation

## 2014-07-10 MED ORDER — LEVOTHYROXINE SODIUM 100 MCG PO TABS: 100.0000 ug | ORAL_TABLET | Freq: Every day | ORAL | Status: DC

## 2014-07-10 NOTE — Progress Notes (Signed)
Patient ID: Crystal Fleming, female   DOB: 06/01/1975, 39 y.o.   MRN: [id number]  CC: medication refill   HPI: Crystal Fleming is a 39 y.o. female here today for a follow up visit.  Patient has past medical history of of hypothyroidism.  She reports that she was unable to get refills of her medications and needed a appointment to get medications. She has no chief complaint today.   Patient has No headache, No chest pain, No abdominal pain - No Nausea, No new weakness tingling or numbness, No Cough - SOB.  Allergies  Allergen Reactions  . Penicillins    Past Medical History  Diagnosis Date  . Hypothyroid 2010   Current Outpatient Prescriptions on File Prior to Visit  Medication Sig Dispense Refill  . levothyroxine (SYNTHROID) 100 MCG tablet Take 1 tablet (100 mcg total) by mouth daily. (Patient not taking: Reported on 07/10/2014) 90 tablet 0  . norgestimate-ethinyl estradiol (ORTHO-CYCLEN,SPRINTEC,PREVIFEM) 0.25-35 MG-MCG tablet Take 1 tablet by mouth daily. (Patient not taking: Reported on 07/10/2014) 1 Package 11  . senna-docusate (SENOKOT S) 8.6-50 MG per tablet Take 1 tablet by mouth at bedtime. May take 2 tabs at night if still constipated (Patient not taking: Reported on 04/10/2014) 60 tablet 10  . Vitamin D, Ergocalciferol, (DRISDOL) 50000 UNITS CAPS capsule Take 1 capsule (50,000 Units total) by mouth every 7 (seven) days. (Patient not taking: Reported on 07/10/2014) 12 capsule 0   No current facility-administered medications on file prior to visit.   History reviewed. No pertinent family history. History   Social History  . Marital Status: Single    Spouse Name: N/A  . Number of Children: N/A  . Years of Education: N/A   Occupational History  . Not on file.   Social History Main Topics  . Smoking status: Never Smoker   . Smokeless tobacco: Not on file  . Alcohol Use: No  . Drug Use: No  . Sexual Activity: Not Currently    Birth Control/ Protection: OCP    Other Topics Concern  . Not on file   Social History Narrative    Review of Systems: Constitutional: Negative for fever, chills, diaphoresis, activity change, appetite change and fatigue. HENT: Negative for ear pain, nosebleeds, congestion, facial swelling, rhinorrhea, neck pain, neck stiffness and ear discharge.  Eyes: Negative for pain, discharge, redness, itching and visual disturbance. Respiratory: Negative for cough, choking, chest tightness, shortness of breath, wheezing and stridor.  Cardiovascular: Negative for chest pain, palpitations and leg swelling. Gastrointestinal: Negative for abdominal distention. Genitourinary: Negative for dysuria, urgency, frequency, hematuria, flank pain, decreased urine volume, difficulty urinating and dyspareunia.  Musculoskeletal: Negative for back pain, joint swelling, arthralgias and gait problem. Neurological: Negative for dizziness, tremors, seizures, syncope, facial asymmetry, speech difficulty, weakness, light-headedness, numbness and headaches.  Hematological: Negative for adenopathy. Does not bruise/bleed easily. Psychiatric/Behavioral: Negative for hallucinations, behavioral problems, confusion, dysphoric mood, decreased concentration and agitation.    Objective:   Filed Vitals:   07/10/14 1233  BP: 118/79  Pulse: 58  Temp: 98.3 F (36.8 C)  Resp: 16    Physical Exam Not completed   Lab Results  Component Value Date   WBC 7.8 11/08/2013   HGB 13.8 11/08/2013   HCT 38.5 11/08/2013   MCV 88.1 11/08/2013   PLT 250 11/08/2013   Lab Results  Component Value Date   CREATININE 0.58 10/25/2012   BUN 9 10/25/2012   NA 138 10/25/2012   K 4.3 10/25/2012   CL 100 10/25/2012  CO2 29 10/25/2012    No results found for: HGBA1C Lipid Panel     Component Value Date/Time   CHOL 211* 10/25/2012 1103   TRIG 207* 10/25/2012 1103   HDL 49 10/25/2012 1103   CHOLHDL 4.3 10/25/2012 1103   VLDL 41* 10/25/2012 1103   LDLCALC 121*  10/25/2012 1103       Assessment and plan:   [name] was seen today for follow-up.  Diagnoses and all orders for this visit:  Hypothyroidism, unspecified hypothyroidism type Orders: -     levothyroxine (SYNTHROID) 100 MCG tablet; Take 1 tablet (100 mcg total) by mouth daily. Refilled patient medications, labs are up to date. She will RTC in 6 months for repeat testing     Due to language barrier, an interpreter was present during the history-taking and subsequent discussion (and for part of the physical exam) with this patient.   Holland Commons, NP-C [facility] [phone] 07/10/2014, 12:53 PM

## 2014-07-10 NOTE — Progress Notes (Signed)
Pt is here following up on her hypothyroidism. Pt has been out her medications for about two weeks. Pt is requesting to have her medications refilled. Interpreter [place].

## 2014-07-19 ENCOUNTER — Ambulatory Visit: Payer: Self-pay | Attending: Internal Medicine | Admitting: Family Medicine

## 2014-07-19 VITALS — BP 144/84 | HR 77 | Temp 98.7°F

## 2014-07-19 DIAGNOSIS — I889 Nonspecific lymphadenitis, unspecified: Secondary | ICD-10-CM

## 2014-07-19 NOTE — Progress Notes (Signed)
Patient ID: Crystal Fleming, female   DOB: 05/13/1975, 39 y.o.   MRN: [id number]  CC:  Lump in throat.  HPI:  Patient presents with a 3 day history of tender lump under chin. She denies any other symptoms. There is no dental problem, no inflammation or infection of mouth.  EXAM:  TMS, clear. Eyes clear. Throat clear. Neck is supple FROM w/o adenopathy or tenderness except for one solitary submental tender lymph node. Lungs are clear. HS are regular w/g,r ASSESSMENT:  Lymphadenopathy  Plan:  Observation Reassurance Follow-up if additional symptoms development or further nodes enlarge and/or become tender.   Henrietta HooverLinda C. Breylin Dom, FNP-BC [facility] [phone]

## 2014-07-19 NOTE — Patient Instructions (Signed)
This a sign of a minor inflammation somewhere in the mouth. I do not see any signs of infection. Don't worry. Don't mess with it. Some back if develops other symptoms or more lumps develop.

## 2014-07-19 NOTE — Progress Notes (Signed)
Patient complains of a "painful lump" in her throat that bothers her when she eats. She also felt she had a fever this am but did not verify with a thermometer.

## 2014-07-25 ENCOUNTER — Ambulatory Visit: Payer: Self-pay | Admitting: Internal Medicine

## 2014-12-25 ENCOUNTER — Ambulatory Visit: Payer: Self-pay | Attending: Internal Medicine

## 2015-01-29 ENCOUNTER — Encounter: Payer: Self-pay | Admitting: Internal Medicine

## 2015-01-29 ENCOUNTER — Ambulatory Visit: Payer: Self-pay | Attending: Internal Medicine | Admitting: Internal Medicine

## 2015-01-29 VITALS — BP 132/83 | HR 80 | Temp 99.2°F | Resp 16 | Ht 62.0 in | Wt 162.0 lb

## 2015-01-29 DIAGNOSIS — N926 Irregular menstruation, unspecified: Secondary | ICD-10-CM | POA: Insufficient documentation

## 2015-01-29 DIAGNOSIS — Z880 Allergy status to penicillin: Secondary | ICD-10-CM | POA: Insufficient documentation

## 2015-01-29 DIAGNOSIS — N898 Other specified noninflammatory disorders of vagina: Secondary | ICD-10-CM

## 2015-01-29 DIAGNOSIS — E039 Hypothyroidism, unspecified: Secondary | ICD-10-CM | POA: Insufficient documentation

## 2015-01-29 DIAGNOSIS — Z79899 Other long term (current) drug therapy: Secondary | ICD-10-CM | POA: Insufficient documentation

## 2015-01-29 DIAGNOSIS — N760 Acute vaginitis: Secondary | ICD-10-CM | POA: Insufficient documentation

## 2015-01-29 LAB — POCT URINALYSIS DIPSTICK
BILIRUBIN UA: NEGATIVE
GLUCOSE UA: NEGATIVE
Ketones, UA: NEGATIVE
Leukocytes, UA: NEGATIVE
Nitrite, UA: NEGATIVE
Protein, UA: NEGATIVE
SPEC GRAV UA: 1.02
Urobilinogen, UA: 0.2
pH, UA: 7

## 2015-01-29 LAB — POCT URINE PREGNANCY: Preg Test, Ur: NEGATIVE

## 2015-01-29 MED ORDER — METRONIDAZOLE 500 MG PO TABS: 500.0000 mg | ORAL_TABLET | Freq: Two times a day (BID) | ORAL | Status: DC

## 2015-01-29 NOTE — Progress Notes (Signed)
Patient ID: Crystal Fleming, female   DOB: Mar 20, 1976, 39 y.o.   MRN: [id number]  CC: irregular menses, vaginal discharge   HPI: Crystal Fleming is a 39 y.o. female here today for a follow up visit.  Patient has past medical history of hypothyroidism. Patient reports that she has been having problems with menstrual bleeding for several weeks. She states that she bleeds the whole month and will usually only stop for one week and then it returns. The blood is often bright red but at other times dark like coffee. She notes that she stopped taking OCP 8 months ago because of side effects. Some associated lower abdominal pain when she has vaginal bleeding. Vaginal discharge that is coffee colored with odor first noticed was 2 months ago. No dysuria, itching, lesions, or new sexual partners. LMP 01/26/15.   Allergies  Allergen Reactions  . Penicillins    Past Medical History  Diagnosis Date  . Hypothyroid 2010   Current Outpatient Prescriptions on File Prior to Visit  Medication Sig Dispense Refill  . levothyroxine (SYNTHROID) 100 MCG tablet Take 1 tablet (100 mcg total) by mouth daily. 90 tablet 3   No current facility-administered medications on file prior to visit.   History reviewed. No pertinent family history. Social History   Social History  . Marital Status: Single    Spouse Name: N/A  . Number of Children: N/A  . Years of Education: N/A   Occupational History  . Not on file.   Social History Main Topics  . Smoking status: Never Smoker   . Smokeless tobacco: Not on file  . Alcohol Use: No  . Drug Use: No  . Sexual Activity: Not Currently    Birth Control/ Protection: OCP   Other Topics Concern  . Not on file   Social History Narrative    Review of Systems: Other than what is stated in HPI, all other systems are negative.    Objective:   Filed Vitals:   01/29/15 1416  BP: 132/83  Pulse: 80  Temp: 99.2 F (37.3 C)  Resp: 16    Physical  Exam  Constitutional: She is oriented to person, place, and time.  Cardiovascular: Normal rate and regular rhythm.   Pulmonary/Chest: Effort normal and breath sounds normal.  Abdominal: Soft. Bowel sounds are normal. She exhibits no distension. There is tenderness.  Genitourinary: Uterus normal. Cervix exhibits no motion tenderness, no discharge and no friability. Right adnexum displays no tenderness. Left adnexum displays no tenderness. Vaginal discharge (dark coffee colored, odor noted) found.  Lymphadenopathy:       Right: No inguinal adenopathy present.       Left: No inguinal adenopathy present.  Neurological: She is alert and oriented to person, place, and time.  Skin: No erythema.  Psychiatric: She has a normal mood and affect.     Lab Results  Component Value Date   WBC 7.8 11/08/2013   HGB 13.8 11/08/2013   HCT 38.5 11/08/2013   MCV 88.1 11/08/2013   PLT 250 11/08/2013   Lab Results  Component Value Date   CREATININE 0.58 10/25/2012   BUN 9 10/25/2012   NA 138 10/25/2012   K 4.3 10/25/2012   CL 100 10/25/2012   CO2 29 10/25/2012    No results found for: HGBA1C Lipid Panel     Component Value Date/Time   CHOL 211* 10/25/2012 1103   TRIG 207* 10/25/2012 1103   HDL 49 10/25/2012 1103   CHOLHDL 4.3 10/25/2012 1103  VLDL 41* 10/25/2012 1103   LDLCALC 121* 10/25/2012 1103       Assessment and plan:   Crystal Fleming was seen today for menstrual problem.  Diagnoses and all orders for this visit:  Irregular menstrual cycle -     POCT urine pregnancy -     POCT urinalysis dipstick  Vaginal discharge Appears consistent with old blood in vaginal vault and odor is consistent with bacterial vaginosis   Vaginitis -     metroNIDAZOLE (FLAGYL) 500 MG tablet; Take 1 tablet (500 mg total) by mouth 2 (two) times daily. See above  Hypothyroidism, unspecified hypothyroidism type -     TSH Will send refills based on blood results  Due to language barrier, an  interpreter was present during the history-taking and subsequent discussion (and for part of the physical exam) with this patient.   Return if symptoms worsen or fail to improve.       Ambrose FinlandValerie A Keck, NP-C [facility] [phone] 01/29/2015, 2:36 PM'

## 2015-01-29 NOTE — Progress Notes (Signed)
C/C irregular menses x4 in one month Vaginal discharge coffee color with odor  Low abdominal pain  No pain today  No hx tobacco

## 2015-01-29 NOTE — Patient Instructions (Signed)
Vaginosis bacteriana  (Bacterial Vaginosis)  La vaginosis bacteriana es una infección vaginal que perturba el equilibrio normal de las bacterias que se encuentran en la vagina. Es el resultado de un crecimiento excesivo de ciertas bacterias. Esta es la infección vaginal más frecuente en mujeres en edad reproductiva. El tratamiento es importante para prevenir complicaciones, especialmente en mujeres embarazadas, dado que puede causar un parto prematuro.  CAUSAS   La vaginosis bacteriana se origina por un aumento de bacterias nocivas que, generalmente, están presentes en cantidades más pequeñas en la vagina. Varios tipos diferentes de bacterias pueden causar esta afección. Sin embargo, la causa de su desarrollo no se comprende totalmente.  FACTORES DE RIESGO  Ciertas actividades o comportamientos pueden exponerlo a un mayor riesgo de desarrollar vaginosis bacteriana, entre los que se incluyen:  · Tener una nueva pareja sexual o múltiples parejas sexuales.  · Las duchas vaginales  · El uso del DIU (dispositivo intrauterino) como método anticonceptivo.  El contagio no se produce en baños, por ropas de cama, en piscinas o por contacto con objetos.  SIGNOS Y SÍNTOMAS   Algunas mujeres que padecen vaginosis bacteriana no presentan signos ni síntomas. Los síntomas más comunes son:  · Secreción vaginal de color grisáceo.  · Secreción vaginal con olor similar al pescado, especialmente después de mantener relaciones sexuales.  · Picazón o sensación de ardor en la vagina o la vulva.  · Ardor o dolor al orinar.  DIAGNÓSTICO   Su médico analizará su historia clínica y le examinará la vagina para detectar signos de vaginosis bacteriana. Puede tomarle una muestra de flujo vaginal. Su médico examinará esta muestra con un microscopio para controlar las bacterias y células anormales. También puede realizarse un análisis del pH vaginal.   TRATAMIENTO   La vaginosis bacteriana puede tratarse con antibióticos, en forma de comprimidos o  de crema vaginal. Puede indicarse una segunda tanda de antibióticos si la afección se repite después del tratamiento. Debido a que la vaginosis bacteriana aumenta el riesgo de contraer enfermedades de transmisión sexual, el tratamiento puede ayudar a reducir el riesgo de clamidia, gonorrea, VIH y herpes.  INSTRUCCIONES PARA EL CUIDADO EN EL HOGAR   · Tome solo medicamentos de venta libre o recetados, según las indicaciones del médico.  · Si le han recetado antibióticos, tómelos como se le indicó. Asegúrese de que finaliza la prescripción completa aunque se sienta mejor.  · Comunique a sus compañeros sexuales que sufre una infección vaginal. Deben consultar a su médico y recibir tratamiento si tienen problemas, como picazón o una erupción cutánea leve.  · Durante el tratamiento, es importante que siga estas indicaciones:    Evite mantener relaciones sexuales o use preservativos de la forma correcta.    No se haga duchas vaginales.    Evite consumir alcohol como se lo haya indicado el médico.    Evite amamantar como se lo haya indicado el médico.  SOLICITE ATENCIÓN MÉDICA SI:   · Sus síntomas no mejoran después de 3 días de tratamiento.  · Aumenta la secreción o el dolor.  · Tiene fiebre.  ASEGÚRESE DE QUE:   · Comprende estas instrucciones.  · Controlará su afección.  · Recibirá ayuda de inmediato si no mejora o si empeora.  PARA OBTENER MÁS INFORMACIÓN   Centros para el control y la prevención de enfermedades (Centers for Disease Control and Prevention, CDC): www.cdc.gov/std  Asociación [place] de la Salud Sexual (American Sexual Health Association, SHA): www.ashastd.org      Esta información no   tiene como fin reemplazar el consejo del médico. Asegúrese de hacerle al médico cualquier pregunta que tenga.     Document Released: 06/29/2007 Document Revised: 04/12/2014  Elsevier Interactive Patient Education ©2016 Elsevier Inc.

## 2015-01-30 LAB — CERVICOVAGINAL ANCILLARY ONLY
Chlamydia: NEGATIVE
Neisseria Gonorrhea: NEGATIVE

## 2015-01-30 LAB — TSH: TSH: 3.903 u[IU]/mL (ref 0.350–4.500)

## 2015-01-31 LAB — CERVICOVAGINAL ANCILLARY ONLY: Wet Prep (BD Affirm): NEGATIVE

## 2015-02-03 ENCOUNTER — Telehealth: Payer: Self-pay

## 2015-02-03 NOTE — Telephone Encounter (Signed)
Patient returned phone call and is aware of her normal lab results Interpreter line used [place] ID#  [id number]

## 2015-02-03 NOTE — Telephone Encounter (Signed)
Interpreter line used [place] ID# [id number] Patient not available  Left message with family member to return our call 

## 2015-02-03 NOTE — Telephone Encounter (Signed)
-----   Message from Ambrose FinlandValerie A Keck, NP sent at 01/31/2015  8:01 PM EDT ----- Labs normal, may refill current dose of Synthroid if needed.

## 2015-04-09 MED FILL — ?LEVOTHYROXINE 100 MCG TAB: 100 | 30 days supply | Qty: 30 | Fill #8

## 2015-05-13 MED FILL — ?LEVOTHYROXINE 100 MCG TAB: 100 | 30 days supply | Qty: 30 | Fill #9

## 2015-05-28 ENCOUNTER — Ambulatory Visit: Payer: Self-pay | Attending: Internal Medicine | Admitting: Internal Medicine

## 2015-05-28 ENCOUNTER — Encounter: Payer: Self-pay | Admitting: Internal Medicine

## 2015-05-28 ENCOUNTER — Encounter: Payer: Self-pay | Admitting: Clinical

## 2015-05-28 VITALS — BP 128/73 | HR 82 | Temp 98.0°F | Resp 16 | Ht 62.0 in | Wt 165.8 lb

## 2015-05-28 DIAGNOSIS — Z880 Allergy status to penicillin: Secondary | ICD-10-CM | POA: Insufficient documentation

## 2015-05-28 DIAGNOSIS — N898 Other specified noninflammatory disorders of vagina: Secondary | ICD-10-CM | POA: Insufficient documentation

## 2015-05-28 DIAGNOSIS — R1030 Lower abdominal pain, unspecified: Secondary | ICD-10-CM | POA: Insufficient documentation

## 2015-05-28 DIAGNOSIS — E039 Hypothyroidism, unspecified: Secondary | ICD-10-CM | POA: Insufficient documentation

## 2015-05-28 DIAGNOSIS — Z79899 Other long term (current) drug therapy: Secondary | ICD-10-CM | POA: Insufficient documentation

## 2015-05-28 DIAGNOSIS — A499 Bacterial infection, unspecified: Secondary | ICD-10-CM

## 2015-05-28 DIAGNOSIS — N760 Acute vaginitis: Secondary | ICD-10-CM | POA: Insufficient documentation

## 2015-05-28 DIAGNOSIS — B9689 Other specified bacterial agents as the cause of diseases classified elsewhere: Secondary | ICD-10-CM

## 2015-05-28 LAB — POCT URINALYSIS DIPSTICK
GLUCOSE UA: NEGATIVE
Ketones, UA: NEGATIVE
Leukocytes, UA: NEGATIVE
Nitrite, UA: NEGATIVE
PROTEIN UA: 30
RBC UA: NEGATIVE
SPEC GRAV UA: 1.02
UROBILINOGEN UA: 1
pH, UA: 7

## 2015-05-28 LAB — POCT URINE PREGNANCY: PREG TEST UR: NEGATIVE

## 2015-05-28 MED ORDER — LEVOTHYROXINE SODIUM 100 MCG PO TABS: 100.0000 ug | ORAL_TABLET | Freq: Every day | ORAL | Status: DC

## 2015-05-28 MED ORDER — METRONIDAZOLE 500 MG PO TABS: 500.0000 mg | ORAL_TABLET | Freq: Two times a day (BID) | ORAL | Status: DC

## 2015-05-28 MED FILL — metroNIDAZOLE 500 MG TABS: 500 | 7 days supply | Qty: 14 | Fill #0

## 2015-05-28 NOTE — Progress Notes (Signed)
Interpreter line used  Patient complains of having some pain near her ovary Patient also complains of having a discharge with a foul odor Symptoms started about two weeks ago

## 2015-05-28 NOTE — Progress Notes (Signed)
Patient ID: Crystal Fleming, female   DOB: 02-01-1976, 40 y.o.   MRN: [id number]  CC: abdominal pain, vaginal discharge  HPI: Crystal Fleming is a 40 y.o. female here today for a follow up visit.  Patient has past medical history of thyroid disease. Patient reports that two weeks ago she noticed a foul odor from her vagina with vaginal discharge. Since that time she now reports that she is having pain in her lower abdomin where her ovaries are located.  She is requesting refill son her Synthroid today.  Allergies  Allergen Reactions  . Penicillins    Past Medical History  Diagnosis Date  . Hypothyroid 2010   Current Outpatient Prescriptions on File Prior to Visit  Medication Sig Dispense Refill  . levothyroxine (SYNTHROID) 100 MCG tablet Take 1 tablet (100 mcg total) by mouth daily. 90 tablet 3  . metroNIDAZOLE (FLAGYL) 500 MG tablet Take 1 tablet (500 mg total) by mouth 2 (two) times daily. 14 tablet 0   No current facility-administered medications on file prior to visit.   History reviewed. No pertinent family history. Social History   Social History  . Marital Status: Single    Spouse Name: N/A  . Number of Children: N/A  . Years of Education: N/A   Occupational History  . Not on file.   Social History Main Topics  . Smoking status: Never Smoker   . Smokeless tobacco: Not on file  . Alcohol Use: No  . Drug Use: No  . Sexual Activity: Not Currently    Birth Control/ Protection: OCP   Other Topics Concern  . Not on file   Social History Narrative    Review of Systems: Other than what is stated in HPI, all other systems are negative.   Objective:   Filed Vitals:   05/28/15 0958  BP: 128/73  Pulse: 82  Temp: 98 F (36.7 C)  Resp: 16    Physical Exam  Abdominal: Soft. Bowel sounds are normal. There is no tenderness.  Genitourinary: Cervix exhibits no motion tenderness, no discharge and no friability. Right adnexum displays no tenderness.  Left adnexum displays no tenderness. Vaginal discharge (odor) found.  Lymphadenopathy:       Right: No inguinal adenopathy present.       Left: No inguinal adenopathy present.  Psychiatric: She has a normal mood and affect.     Lab Results  Component Value Date   WBC 7.8 11/08/2013   HGB 13.8 11/08/2013   HCT 38.5 11/08/2013   MCV 88.1 11/08/2013   PLT 250 11/08/2013   Lab Results  Component Value Date   CREATININE 0.58 10/25/2012   BUN 9 10/25/2012   NA 138 10/25/2012   K 4.3 10/25/2012   CL 100 10/25/2012   CO2 29 10/25/2012    No results found for: HGBA1C Lipid Panel     Component Value Date/Time   CHOL 211* 10/25/2012 1103   TRIG 207* 10/25/2012 1103   HDL 49 10/25/2012 1103   CHOLHDL 4.3 10/25/2012 1103   VLDL 41* 10/25/2012 1103   LDLCALC 121* 10/25/2012 1103       Assessment and plan:   Crystal Fleming was seen today for ovary pain.  Diagnoses and all orders for this visit:  Lower abdominal pain -     Urinalysis Dipstick -     POCT urine pregnancy No infection, negative preg test  Bacterial vaginitis -    Begin metroNIDAZOLE (FLAGYL) 500 MG tablet; Take 1 tablet (500 mg total) by  mouth 2 (two) times daily. -     Cervicovaginal ancillary only Patient has fishy odor, I suspect she has Bacterial Vaginosis. I will treat as such. Explained that if it may cause abdominal pain  Hypothyroidism, unspecified hypothyroidism type -     levothyroxine (SYNTHROID) 100 MCG tablet; Take 1 tablet (100 mcg total) by mouth daily. -     TSH; Future -     T4, Free; Future She will require a TSH recheck in May.   Return for May Lab visit-TSH/Free T4.       Ambrose Finland, NP-C [facility] [phone] 05/28/2015, 10:01 AM

## 2015-05-28 NOTE — Progress Notes (Signed)
Depression screen [facility] 2/9 05/28/2015 01/29/2015 11/08/2013  Decreased Interest 0 0 0  Down, Depressed, Hopeless 0 0 0  PHQ - 2 Score 0 0 0    GAD 7 : Generalized Anxiety Score 05/28/2015  Nervous, Anxious, on Edge 0  Control/stop worrying 1  Worry too much - different things 1  Trouble relaxing 1  Restless 0  Easily annoyed or irritable 0  Afraid - awful might happen 1  Total GAD 7 Score 4

## 2015-05-29 LAB — CERVICOVAGINAL ANCILLARY ONLY
CHLAMYDIA, DNA PROBE: NEGATIVE
NEISSERIA GONORRHEA: NEGATIVE
WET PREP (BD AFFIRM): NEGATIVE

## 2015-05-30 ENCOUNTER — Telehealth: Payer: Self-pay

## 2015-05-30 NOTE — Telephone Encounter (Signed)
Interpreter line used [place] ID# [id number] Called patient via interpreter line Verified date of birth Patient is aware of her normal results

## 2015-05-30 NOTE — Telephone Encounter (Signed)
-----   Message from Ambrose Finland, NP sent at 05/29/2015  6:05 PM EST ----- Everything normal

## 2015-06-19 MED FILL — ?LEVOTHYROXINE 100 MCG TAB: 100 | 30 days supply | Qty: 30 | Fill #10

## 2015-07-22 ENCOUNTER — Telehealth: Payer: Self-pay | Admitting: Internal Medicine

## 2015-07-22 NOTE — Telephone Encounter (Signed)
Pt. Called requesting a med refill on levothyroxine (SYNTHROID) 100 MCG tablet. Please f/u with pt.

## 2015-07-23 ENCOUNTER — Other Ambulatory Visit: Payer: Self-pay

## 2015-07-23 DIAGNOSIS — E039 Hypothyroidism, unspecified: Secondary | ICD-10-CM

## 2015-07-23 MED ORDER — LEVOTHYROXINE SODIUM 100 MCG PO TABS: 100.0000 ug | ORAL_TABLET | Freq: Every day | ORAL | Status: DC

## 2015-07-23 MED FILL — ?LEVOTHYROXINE 100 MCG TAB: 100 | 30 days supply | Qty: 30 | Fill #0

## 2015-09-10 ENCOUNTER — Telehealth: Payer: Self-pay | Admitting: Internal Medicine

## 2015-09-10 DIAGNOSIS — E039 Hypothyroidism, unspecified: Secondary | ICD-10-CM

## 2015-09-10 NOTE — Telephone Encounter (Signed)
Patient is needing synthroid

## 2015-09-11 MED ORDER — LEVOTHYROXINE SODIUM 100 MCG PO TABS: 100.0000 ug | ORAL_TABLET | Freq: Every day | ORAL | Status: DC

## 2015-09-11 MED FILL — ?LEVOTHYROXINE 100 MCG TAB: 100 | 30 days supply | Qty: 30 | Fill #0

## 2015-09-11 NOTE — Telephone Encounter (Signed)
Refilled levothyroxine.

## 2015-09-17 ENCOUNTER — Ambulatory Visit: Payer: Self-pay | Attending: Internal Medicine

## 2015-10-17 MED FILL — ?LEVOTHYROXINE 100 MCG TAB: 100 | 30 days supply | Qty: 30 | Fill #1

## 2015-12-04 MED FILL — ?LEVOTHYROXINE 100 MCG TAB: 100 | 30 days supply | Qty: 30 | Fill #2

## 2015-12-24 ENCOUNTER — Ambulatory Visit: Payer: Self-pay | Attending: Internal Medicine | Admitting: Internal Medicine

## 2015-12-24 ENCOUNTER — Encounter: Payer: Self-pay | Admitting: Internal Medicine

## 2015-12-24 VITALS — BP 131/86 | HR 64 | Temp 97.9°F | Wt 163.6 lb

## 2015-12-24 DIAGNOSIS — Z114 Encounter for screening for human immunodeficiency virus [HIV]: Secondary | ICD-10-CM

## 2015-12-24 DIAGNOSIS — Z23 Encounter for immunization: Secondary | ICD-10-CM

## 2015-12-24 DIAGNOSIS — E039 Hypothyroidism, unspecified: Secondary | ICD-10-CM

## 2015-12-24 DIAGNOSIS — Z0000 Encounter for general adult medical examination without abnormal findings: Secondary | ICD-10-CM

## 2015-12-24 DIAGNOSIS — Z880 Allergy status to penicillin: Secondary | ICD-10-CM | POA: Insufficient documentation

## 2015-12-24 LAB — BASIC METABOLIC PANEL WITH GFR
BUN: 10 mg/dL (ref 7–25)
CHLORIDE: 102 mmol/L (ref 98–110)
CO2: 27 mmol/L (ref 20–31)
CREATININE: 0.58 mg/dL (ref 0.50–1.10)
Calcium: 9.1 mg/dL (ref 8.6–10.2)
GFR, Est African American: >89 mL/min (ref >=60)
GFR, Est Non African American: >89 mL/min (ref >=60)
Glucose, Bld: 81 mg/dL (ref 65–99)
POTASSIUM: 4.2 mmol/L (ref 3.5–5.3)
Sodium: 136 mmol/L (ref 135–146)

## 2015-12-24 LAB — LIPID PANEL
CHOL/HDL RATIO: 4.4 ratio (ref <=5.0)
Cholesterol: 228 mg/dL — ABNORMAL HIGH (ref 125–200)
HDL: 52 mg/dL (ref >=46)
LDL CALC: 137 mg/dL — AB (ref <130)
TRIGLYCERIDES: 196 mg/dL — AB (ref <150)
VLDL: 39 mg/dL — AB (ref <30)

## 2015-12-24 LAB — CBC WITH DIFFERENTIAL/PLATELET
BASOS ABS: 0 {cells}/uL (ref 0–200)
BASOS PCT: 0 %
EOS ABS: 49 {cells}/uL (ref 15–500)
Eosinophils Relative: 1 %
HCT: 41.9 % (ref 35.0–45.0)
HEMOGLOBIN: 14.4 g/dL (ref 11.7–15.5)
LYMPHS ABS: 2107 {cells}/uL (ref 850–3900)
Lymphocytes Relative: 43 %
MCH: 31.5 pg (ref 27.0–33.0)
MCHC: 34.4 g/dL (ref 32.0–36.0)
MCV: 91.7 fL (ref 80.0–100.0)
MONO ABS: 441 {cells}/uL (ref 200–950)
MPV: 9.3 fL (ref 7.5–12.5)
Monocytes Relative: 9 %
NEUTROS ABS: 2303 {cells}/uL (ref 1500–7800)
Neutrophils Relative %: 47 %
Platelets: 315 10*3/uL (ref 140–400)
RBC: 4.57 MIL/uL (ref 3.80–5.10)
RDW: 12.9 % (ref 11.0–15.0)
WBC: 4.9 10*3/uL (ref 3.8–10.8)

## 2015-12-24 LAB — TSH: TSH: 9.42 mIU/L — ABNORMAL HIGH

## 2015-12-24 LAB — HIV ANTIBODY (ROUTINE TESTING W REFLEX): HIV 1&2 Ab, 4th Generation: NONREACTIVE

## 2015-12-24 LAB — T4, FREE: Free T4: 0.9 ng/dL (ref 0.8–1.8)

## 2015-12-24 NOTE — Patient Instructions (Addendum)
Crystal Fleming (Health Maintenance, Female) Un estilo de vida saludable y los cuidados preventivos pueden favorecer considerablemente a la salud y Musician. Pregunte a su mdico cul es el cronograma de exmenes peridicos apropiado para usted. Esta es una buena oportunidad para consultarlo sobre cmo prevenir enfermedades y [name] sano. Adems de los controles, hay muchas otras cosas que puede hacer usted mismo. Los expertos han realizado numerosas investigaciones [name] en el estilo de vida y las medidas de prevencin que, [name], lo ayudarn a mantenerse sano. Solicite a su mdico ms informacin. EL PESO Y LA DIETA  Consuma una dieta saludable.  Asegrese de [name] verduras, frutas, productos lcteos de bajo contenido de [name] y [name].  No consuma muchos alimentos de alto contenido de grasas slidas, azcares agregados o sal.  Realice actividad fsica con regularidad. Esta es una de las prcticas ms importantes que puede hacer por su salud.  La mayora de los adultos deben hacer ejercicio durante al menos 173mnutos por semana. El ejercicio debe aumentar la frecuencia cardaca y pActorla transpiracin (ejercicio de iEnola.  La mayora de los adultos tambin deben [name] de elongacin al mToysRusveces a la semana. Agregue esto al su plan de ejercicio de intensidad moderada. Mantenga un peso saludable.  El ndice de masa corporal ([name] es una medida que puede utilizarse para identificar posibles problemas de pBelle Valley Proporciona una estimacin de la grasa corporal basndose en el peso y la altura. Su mdico puede ayudarle a dRadiation protection practitionerIAvenely a lScientist, forensico mTheatre managerun peso saludable.  Para las mujeres de 20aos o ms:  Un IMadera Ambulatory Endoscopy Centermenor de 18,5 se considera bajo peso.  Un IAbbeville Area Medical Centerentre 18,5 y 24,9 es normal.  Un IMethodist Hospitalentre 25 y 29,9 se considera sobrepeso.  Un IMC de 30 o ms se considera  obesidad. Observe los niveles de colesterol y lpidos en la sangre.  Debe comenzar a rEnglish as a second language teacherde lpidos y cResearch officer, trade unionen la sangre a los 20aos y luego repetirlos cada 513aos  Es posible que nAutomotive engineerlos niveles de colesterol con mayor frecuencia si:  Sus niveles de lpidos y colesterol son altos.  Es mayor de 50aos.  Presenta un alto riesgo de padecer enfermedades cardacas. DETECCIN DE CNCER  Cncer de pulmn  Se recomienda realizar exmenes de deteccin de cncer de pulmn a personas adultas entre 557y 833aos que estn en riesgo de dHorticulturist, commercialde pulmn por sus antecedentes de consumo de tabaco.  Se recomienda una tomografa computarizada de baja dosis de los pLiberty Mediaaos a las personas que:  Fuman actualmente.  Hayan dejado el hbito en algn momento en los ltimos 15aos.  Hayan fumado durante 30aos un paquete diario. Un paquete-ao equivale a fumar un promedio de un paquete de cigarrillos diario durante un ao.  Los exmenes de deteccin anuales deben continuar hasta que hayan pasado 15aos desde que dej de fumar.  Ya no debern realizarse si tiene un problema de salud que le impida recibir tratamiento para eScience writerde pulmn. Cncer de mama  Practique la autoconciencia de la mama. Esto significa reconocer la apariencia normal de sus mamas y cmo las siente.  Tambin significa realizar autoexmenes regulares de lJohnson & Johnson Informe a su mdico sobre cualquier cambio, sin importar cun pequeo sea.  Si tiene entre 20 y 31aos, un mdico debe realizarle un examen clnico de las mBrunswick Corporationparte del examen regular de sHohenwald cKentucky  1 a 3aos.  Si tiene 40aos o ms, debe realizarse un examen clnico de las mamas todos los aos. Tambin considere realizarse una radiografa de las mamas (mamografa) todos los aos.  Si tiene antecedentes familiares de cncer de mama, hable con su mdico para someterse a un estudio  gentico.  Si tiene alto riesgo de padecer cncer de mama, hable con su mdico para someterse a una resonancia magntica y una mamografa todos los aos.  La evaluacin del gen del cncer de mama (BRCA) se recomienda a mujeres que tengan familiares con cnceres relacionados con el BRCA. Los cnceres relacionados con el BRCA incluyen los siguientes:  Mama.  Ovario.  Trompas.  Cnceres de peritoneo.  Los resultados de la evaluacin determinarn la necesidad de asesoramiento gentico y de anlisis de BRCA1 y BRCA2. Cncer de cuello del tero [name] puede recomendarle que se haga pruebas peridicas de deteccin de cncer de los rganos de la pelvis (ovarios, tero y vagina). Estas pruebas incluyen un examen plvico, que abarca controlar si se produjeron cambios microscpicos en la superficie del cuello del tero (prueba de Papanicolaou). Pueden recomendarle que se haga estas pruebas cada 3aos, a partir de los 21aos.  A las mujeres que tienen entre 30 y 65aos, los mdicos pueden recomendarles que se sometan a exmenes plvicos y pruebas de Papanicolaou cada 3aos, o a la prueba de Papanicolaou y el examen plvico en combinacin con estudios de deteccin del virus del papiloma humano (VPH) cada 5aos. Algunos tipos de VPH aumentan el riesgo de padecer cncer de cuello del tero. La prueba para la deteccin del VPH tambin puede realizarse a mujeres de cualquier edad cuyos resultados de la prueba de Papanicolaou no sean claros.  Es posible que otros mdicos no recomienden exmenes de deteccin a mujeres no embarazadas que se consideran sujetos de bajo riesgo de padecer cncer de pelvis y que no tienen sntomas. Pregntele al mdico si un examen plvico de deteccin es adecuado para usted.  Si ha recibido un tratamiento para el cncer cervical o una enfermedad que podra causar cncer, necesitar realizarse una prueba de Papanicolaou y controles durante al menos 20 aos de concluido el  tratamiento. Si no se ha hecho el Papanicolaou con regularidad, debern volver a evaluarse los factores de riesgo (como tener un nuevo compaero sexual), para determinar si debe realizarse los estudios nuevamente. Algunas mujeres sufren problemas mdicos que aumentan la probabilidad de contraer cncer de cuello del tero. En estos casos, el mdico podr indicar que se realicen controles y pruebas de Papanicolaou con ms frecuencia. Cncer colorrectal  Este tipo de cncer puede detectarse y a menudo prevenirse.  Por lo general, los estudios de rutina se deben comenzar a hacer a partir de los 50 aos y hasta los 75 aos.  Sin embargo, el mdico podr aconsejarle que lo haga antes, si tiene factores de riesgo para el cncer de colon.  Tambin puede recomendarle que use un kit de prueba para hallar sangre oculta en la materia fecal.  Es posible que se use una pequea cmara en el extremo de un tubo para examinar directamente el colon (sigmoidoscopia o colonoscopia) a fin de detectar formas tempranas de cncer colorrectal.  Los exmenes de rutina generalmente comienzan a los 50aos.  El examen directo del colon se debe repetir cada 5 a 10aos hasta los 75aos. Sin embargo, es posible que se realicen exmenes con mayor frecuencia, si se detectan formas tempranas de plipos precancerosos o pequeos bultos. Cncer de piel  Revise   la piel de la cabeza a los pies con regularidad.  Informe a su mdico si aparecen nuevos lunares o los que tiene se modifican, especialmente en su forma y color.  Tambin notifique al mdico si tiene un lunar que es ms grande que el tamao de una goma de lpiz.  Siempre use pantalla solar. Aplique pantalla solar de Kerry Dory y repetida a lo largo del Training and development officer.  Protjase usando mangas y [employer], un sombrero de ala ancha y gafas para el sol, siempre que se encuentre en el exterior. ENFERMEDADES CARDACAS, DIABETES E HIPERTENSIN ARTERIAL   La hipertensin  arterial causa enfermedades cardacas y [place] el riesgo de ictus. La hipertensin arterial es ms probable en los siguientes casos:  Las personas que tienen la presin arterial en el extremo del rango normal (100-139/85-89 mm Hg).  Las personas con sobrepeso u obesidad.  Las Retail banker.  Si usted tiene entre 18 y 39 aos, debe medirse la presin arterial cada 3 a 5 aos. Si usted tiene 40 aos o ms, debe medirse la presin arterial [employer]. Debe medirse la presin arterial dos veces: una vez cuando est en un hospital o una clnica y la otra vez cuando est en otro sitio. Registre el promedio de [employer]. Para controlar su presin arterial cuando no est en un hospital o [place], puede usar lo siguiente:  Ardelia Mems mquina automtica para medir la presin arterial en una farmacia.  Un monitor para medir la presin arterial en el hogar.  Si tiene entre 33 y 105 aos, consulte a su mdico si debe tomar aspirina para prevenir el ictus.  Realcese exmenes de deteccin de la diabetes con regularidad. Esto incluye la toma de [place] de sangre para controlar el nivel de azcar en la sangre durante el [place].  Si tiene un peso normal y un bajo riesgo de padecer diabetes, realcese este anlisis cada tres aos despus de los 45aos.  Si tiene sobrepeso y un alto riesgo de padecer diabetes, considere someterse a este anlisis antes o con mayor frecuencia. PREVENCIN DE INFECCIONES  HepatitisB  Si tiene un riesgo ms alto de Museum/gallery curator hepatitis B, debe someterse a un examen de deteccin de este virus. Se considera que tiene un alto riesgo de Museum/gallery curator hepatitis B si:  Naci en un pas donde la hepatitis B es frecuente. Pregntele a su mdico qu pases son considerados de Public affairs consultant.  Sus padres nacieron en un pas de alto riesgo y usted no recibi una vacuna que lo proteja contra la hepatitis B (vacuna contra la hepatitis B).  [place].  [place] agujas  para inyectarse drogas.  Vive con alguien que tiene hepatitis B.  Ha tenido sexo con alguien que tiene hepatitis B.  Recibe tratamiento de hemodilisis.  Toma ciertos medicamentos para el cncer, trasplante de rganos y afecciones autoinmunitarias. Hepatitis C  Se recomienda un anlisis de [place] para:  Todos los que nacieron entre 1944 y [id number].  Todas las personas que tengan un riesgo de haber contrado hepatitis C. Enfermedades de transmisin sexual (ETS).  Debe realizarse pruebas de deteccin de enfermedades de transmisin sexual (ETS), incluidas gonorrea y clamidia si:  Es sexualmente activo y es menor de 24aos.  Es mayor de 24aos, y Investment banker, operational informa que corre riesgo de tener este tipo de infecciones.  La actividad sexual ha cambiado desde que le hicieron la ltima prueba de deteccin y tiene un riesgo mayor de Best boy clamidia o Radio broadcast assistant. Pregntele  al mdico si usted tiene riesgo.  Si no tiene el VIH, pero corre riesgo de infectarse por el virus, se recomienda tomar diariamente un medicamento recetado para evitar la infeccin. Esto se conoce como profilaxis previa a la exposicin. Se considera que est en riesgo si:  Es [place] sexualmente y no [place] preservativos habitualmente o no conoce el estado del VIH de sus Advertising copywriter.  Se inyecta drogas.  Es [place] sexualmente con Ardelia Mems pareja que tiene VIH. Consulte a su mdico para saber si tiene un alto riesgo de infectarse por el VIH. Si opta por comenzar la profilaxis previa a la exposicin, primero debe realizarse anlisis de deteccin del VIH. Luego, le harn anlisis cada 55mses mientras est tomando los medicamentos para la profilaxis previa a la exposicin.  [facility]  Si es premenopusica y puede quedar eAuxier solicite a su mdico asesoramiento previo a la concepcin.  Si puede quedar embarazada, tome 400 a [id number]([id number]) de cido fAnheuser-Busch  Si desea evitar el embarazo, hable con su  mdico sobre el control de la natalidad (anticoncepcin). OSTEOPOROSIS Y MENOPAUSIA   La osteoporosis es una enfermedad en la que los huesos pierden los minerales y la fuerza por el avance de la edad. El resultado pueden ser fracturas graves en los hTh[place]hill El riesgo de osteoporosis puede identificarse con uArdelia Memsprueba de densidad sea.  Si tiene 65aos o ms, o si est en riesgo de sufrir osteoporosis y fracturas, pregunte a su mdico si debe someterse a exmenes.  Consulte a su mdico si debe tomar un suplemento de calcio o de vitamina D para reducir el riesgo de osteoporosis.  La menopausia puede presentar ciertos sntomas fsicos y rGaffer  La terapia de reemplazo hormonal puede reducir algunos de estos sntomas y rGaffer Consulte a su mdico para saber si la terapia de reemplazo hormonal es conveniente para usted.  INSTRUCCIONES PARA EL CUIDADO EN EL HOGAR   Realcese los estudios de rutina de la salud, dentales y de lPublic librarian  MGarland  No consuma ningn producto que contenga tabaco, lo que incluye cigarrillos, tabaco de mHigher education careers advisero cPsychologist, sport and exercise  Si est embarazada, no beba alcohol.  Si est amamantando, reduzca el consumo de alcohol y la frecuencia con la que consume.  Si es mujer y no est embarazada limite el consumo de alcohol a no ms de 1 medida por da. Una medida equivale a 12onzas de cerveza, 5onzas de vino o 1onzas de bebidas alcohlicas de alta graduacin.  No consuma drogas.  No comparta agujas.  Solicite ayuda a su mdico si necesita apoyo o informacin para abandonar las drogas.  Informe a su mdico si a menudo se siente deprimido.  Notifique a su mdico si alguna vez ha sido vctima de abuso o si no se siente seguro en su hogar.   Esta informacin no tiene cMarine scientistel consejo del mdico. Asegrese de hacerle al mdico cualquier pregunta que tenga.   Document Released: 03/11/2011 Document Revised:  04/12/2014 Elsevier Interactive Patient Education 2016 EFalklandTdap (contra la difteria, el ttanos y lEl Indio: Lo que debe saber (Tdap Vaccine [Tetanus, Diphtheria, and Pertussis]: What You Need to Know) 1. Por qu vacunarse? El ttanos, la difteria y la tosferina son enfermedades muy graves. La vacuna Tdap nos puede proteger de estas enfermedades. Adems, la vacuna Tdap que se aplica a las eChemical engineera los bebs recin nacidos contra la tosferina. En la actualidad, el TOnancock(  trismo) es una enfermedad poco frecuente en los [place]. Provoca la contraccin y el endurecimiento dolorosos de los msculos, por lo general, de todo el cuerpo.  Puede causar el endurecimiento de los msculos de la cabeza y el cuello, de modo que impide abrir la boca, tragar y en algunos casos, Ambulance person. El ttanos causa la muerte de aproximadamente 1de cada 10personas que contraen la infeccin, incluso despus de que reciben la mejor atencin mdica. La DIFTERIA tambin es poco frecuente en los [place] [employer]. Puede causar la formacin de una membrana gruesa en la parte posterior de la garganta.  Esto tiene como consecuencia problemas respiratorios, insuficiencia cardaca, parlisis y [place]. La TOSFERINA (tos convulsa) provoca episodios de tos intensa que pueden dificultar la respiracin y provocar vmitos y trastornos del sueo.  Tambin puede causar prdida de peso, incontinencia y fractura de [employer] [facility]. Dos de cada 100 adolescentes y 5 de cada 100 adultos con tosferina deben ser hospitalizados o tienen complicaciones, que podran incluir neumona y [place]. Estas enfermedades son provocadas por bacterias. La difteria y la tosferina se contagian de Ardelia Mems persona a otra a travs de las secreciones de la tos o el estornudo. El ttanos ingresa al organismo a travs de cortes, rasguos o heridas. Antes de las vacunas, en los [place] se informaban 200000 casos de  difteria, 200000 casos de tosferina y cientos de casos de ttanos cada ao. Desde el inicio de la vacunacin, los informes de casos de ttanos y difteria han disminuido alrededor del 99%, y de tosferina, alrededor del 80%. 2. Edward Jolly Tdap La vacuna Tdap protege a adolescentes y adultos contra el ttanos, la difteria y la tosferina. Una dosis de Tdap se administra a los 59 o 12 aos. Las [place] [employer] no recibieron la vacuna Tdap a esa edad deben recibirla tan pronto como sea posible. Es muy importante que los mdicos y todos aquellos que tengan contacto cercano con bebs menores de 58mses reciban la vacuna Tdap. Las mujeres deben recibir una dosis de Tdap en cada eMedia planner para proteger al recin nacido de la tosferina. Los nios tienen mayor riesgo de complicaciones graves y potencialmente mortales debido a la tosferina. Otra vacuna llamada Td protege contra el ttanos y la difteria, pero no contra la tosferina. Todos deben recibir una dosis de refuerzo de Td cada 10 aos. La Tdap puede aplicarse como uno de estos refuerzos si nunca antes recibi esta vacuna. Tambin se puede aplicar despus de un corte o quemadura grave para prevenir la infeccin por ttanos. [name] o la persona que le aplique la vacuna puede darle ms informacin al rSears Holdings Corporation La Tdap puede administrarse de manera segura simultneamente con otras vacunas. 3. Algunas personas no deben recibir la vSchering-Ploughpersona que alguna vez tuvo una reaccin alrgica potencialmente mortal a uArdelia Memsdosis previa de cualquier vacuna contra el ttanos, la difteria o la tosferina, O que tenga una alergia grave a cualquiera de los componentes de esta vacuna, no debe recibir la vacuna Tdap. Informe a la persona que le aplica la vacuna si tiene cualquier alergia grave.  Una persona que estuvo en estado de coma o sufri mltiples convulsiones en el trmino de los 7das despus de recibir una dosis de DTP o DTaP, o una dosis previa de Tdap, no debe  recibir la vacuna Tdap, salvo que se haya encontrado otra causa que no fuera la vacuna. An puede recibir la Td.  Consulte con su mdico si:  tiene convulsiones u otro problema del  sistema nervioso,  tuvo hinchazn o dolor intenso despus de cualquier vacuna contra la difteria o el ttanos,  alguna vez ha sufrido el sndrome de Guillain-Barr,  no se siente bien el da en que se ha programado la vacuna. 4. Riesgos Con cualquier medicamento, incluyendo las vacunas, existe la posibilidad de que aparezcan efectos secundarios. Suelen ser leves y desaparecen por s solos. Tambin son posibles las reacciones graves, pero en raras ocasiones. La mayora de las personas a las que se les aplica la vacuna Tdap no tienen ningn problema. Problemas leves despus de la vacuna Tdap (No interfirieron en otras actividades)  Dolor en el lugar donde se aplic la vacuna (alrededor de 3 de cada 4 adolescentes o 2 de cada 3 adultos).  Enrojecimiento o hinchazn en el lugar donde se aplic la vacuna (1 de cada 5 personas).  Fiebre leve de al menos 100,4F (38C) (hasta alrededor de 1 cada 25 adolescentes o 1 de cada 100 adultos).  Dolor de cabeza (alrededor de 3 o 4 de cada 10 personas).  Cansancio (alrededor de 1 de cada 3 o 4 personas).  Nuseas, vmitos, diarrea, dolor de estmago (hasta 1 de cada 4 adolescentes o 1 de cada 10 adultos).  Escalofros, dolores articulares (alrededor de 1de cada 10personas).  Dolores corporales (alrededor de 1de cada 3 o 4personas).  Erupcin cutnea, inflamacin de los ganglios (poco frecuente). Problemas moderados despus de recibir la vacuna Tdap (Interfirieron en otras actividades, pero no requirieron atencin mdica)  Dolor en el lugar donde se aplic la vacuna (hasta 1de cada 5 o 6).  Enrojecimiento o inflamacin en el lugar donde se aplic la vacuna (hasta alrededor de 1 de cada 16adolescentes o 1 de cada 12adultos).  Fiebre de ms de 102F  (38,8C) (alrededor de 1 de cada 100 adolescentes o 1 de cada 250 adultos).  Dolor de cabeza (alrededor de 1de cada 7adolescentes o 1de cada 10adultos).  Nuseas, vmitos, diarrea, dolor de estmago (hasta 1 o 3 de cada 100 personas).  Hinchazn de todo el brazo en el que se aplic la vacuna (hasta alrededor de 1de cada 500personas). Problemas graves despus de la vacuna Tdap (Impidieron realizar las actividades habituales; requirieron atencin mdica)  Inflamacin, dolor intenso, sangrado y enrojecimiento en el brazo en que se aplic la vacuna (poco frecuente). Problemas que podran ocurrir despus de cualquier vacuna:  Las personas a veces se desmayan despus de un procedimiento mdico, incluida la vacunacin. Si permanece sentado o recostado durante 15 minutos puede ayudar a evitar los desmayos y las lesiones causadas por las cadas. Informe al mdico si se siente mareado, tiene cambios en la visin o zumbidos en los odos.  Algunas personas sienten un dolor intenso en el hombro y tienen dificultad para mover el brazo donde se coloc la vacuna. Esto sucede con muy poca frecuencia.  Cualquier medicamento puede causar una reaccin alrgica grave. Dichas reacciones son muy poco frecuentes con una vacuna (se calcula que menos de 1en un milln de dosis) y se producen de unos minutos a unas horas despus de la administracin. Al igual que con cualquier medicamento, existe una probabilidad muy remota de que una vacuna cause una lesin grave o la muerte. Se controla permanentemente la seguridad de las vacunas. Para obtener ms informacin, visite: www.cdc.gov/vaccinesafety/. 5. Qu pasa si hay un problema grave? A qu signos debo estar atento?  Observe todo lo que le preocupe, como signos de una reaccin alrgica grave, fiebre muy alta o comportamiento fuera de lo normal.  Los   signos de una reaccin alrgica grave pueden incluir ronchas, hinchazn de la cara y la garganta, dificultad  para respirar, latidos cardacos acelerados, mareos y debilidad. Generalmente, estos comenzaran entre unos pocos minutos y algunas horas despus de la vacunacin. Qu debo hacer?  Si usted piensa que se trata de una reaccin alrgica grave o de otra emergencia que no puede esperar, llame al 911 o lleve a la persona al hospital ms cercano. Sino, llame a su mdico.  Despus, la reaccin debe informarse al Sistema de Informacin sobre Efectos Adversos de las Vacunas (Vaccine Adverse Event Reporting System, VAERS). Su mdico puede presentar este informe, o puede hacerlo usted mismo a travs del sitio web de VAERS, en www.vaers.hhs.gov, o llamando al 1-800-822-7967. VAERS no brinda recomendaciones mdicas. 6. Programa Nacional de Compensacin de Daos por Vacunas El Programa Nacional de Compensacin de Daos por Vacunas (National Vaccine Injury Compensation Program, VICP) es un programa federal que fue creado para compensar a las personas que puedan haber sufrido daos al recibir ciertas vacunas. Aquellas personas que consideren que han sufrido un dao como consecuencia de una vacuna y quieran saber ms acerca del programa y de cmo presentar una denuncia, pueden llamar al 1-800-338-2382 o visitar su sitio web en www.hrsa.gov/vaccinecompensation. Hay un lmite de tiempo para presentar un reclamo de compensacin. 7. Cmo puedo obtener ms informacin?  Consulte a su mdico. Este puede darle el prospecto de la vacuna o recomendarle otras fuentes de informacin.  Comunquese con el servicio de salud de su localidad o su estado.  Comunquese con los Centros para el Control y la Prevencin de Enfermedades (Centers for Disease Control and Prevention , CDC).  Llame al 1-800-232-4636 (1-800-CDC-INFO), o  visite el sitio web de los CDC en www.cdc.gov/vaccines. Declaracin de informacin sobre la vacuna contra la difteria, el ttanos y la tosferina (Tdap) de los CDC (24/02/15)   Esta informacin no tiene  como fin reemplazar el consejo del mdico. Asegrese de hacerle al mdico cualquier pregunta que tenga.   Document Released: 03/08/2012 Document Revised: 04/12/2014 Elsevier Interactive Patient Education 2016 Elsevier Inc. Influenza Virus Vaccine injection (Fluarix) Qu es este medicamento? La VACUNA ANTIGRIPAL ayuda a disminuir el riesgo de contraer la influenza, tambin conocida como la gripe. La vacuna solo ayuda a protegerle contra algunas cepas de influenza. Esta vacuna no ayuda a reducir el riesgo de contraer influenza pandmica H1N1. Este medicamento puede ser utilizado para otros usos; si tiene alguna pregunta consulte con su proveedor de atencin mdica o con su farmacutico. Qu le debo informar a mi profesional de la salud antes de tomar este medicamento? Necesita saber si usted presenta alguno de los siguientes problemas o situaciones: -trastorno de sangrado como hemofilia -fiebre o infeccin -sndrome de Guillain-Barre u otros problemas neurolgicos -problemas del sistema inmunolgico -infeccin por el virus de la inmunodeficiencia humana (VIH) o SIDA -niveles bajos de plaquetas en la sangre -esclerosis mltiple -una reaccin alrgica o inusual a las vacunas antigripales, a los huevos, protenas de pollo, al ltex, a la gentamicina, a otros medicamentos, alimentos, colorantes o conservantes -si est embarazada o buscando quedar embarazada -si est amamantando a un beb Cmo debo utilizar este medicamento? Esta vacuna se administra mediante inyeccin por va intramuscular. Lo administra un profesional de la salud. Recibir una copia de informacin escrita sobre la vacuna antes de cada vacuna. Asegrese de leer este folleto cada vez cuidadosamente. Este folleto puede cambiar con frecuencia. Hable con su pediatra para informarse acerca del uso de este medicamento en nios. Puede requerir   atencin especial. Sobredosis: Pngase en contacto inmediatamente con un centro toxicolgico  o una sala de urgencia si usted cree que haya tomado demasiado medicamento. ATENCIN: Este medicamento es solo para usted. No comparta este medicamento con nadie. Qu sucede si me olvido de una dosis? No se aplica en este caso. Qu puede interactuar con este medicamento? -quimioterapia o radioterapia -medicamentos que suprimen el sistema inmunolgico, tales como etanercept, anakinra, infliximab y adalimumab -medicamentos que tratan o previenen cogulos sanguneos, como warfarina -fenitona -medicamentos esteroideos, como la prednisona o la cortisona -teofilina -vacunas Puede ser que esta lista no menciona todas las posibles interacciones. Informe a su profesional de la salud de todos los productos a base de hierbas, medicamentos de venta libre o suplementos nutritivos que est tomando. Si usted fuma, consume bebidas alcohlicas o si utiliza drogas ilegales, indqueselo tambin a su profesional de la salud. Algunas sustancias pueden interactuar con su medicamento. A qu debo estar atento al usar este medicamento? Informe a su mdico o a su profesional de la salud sobre todos los efectos secundarios que persistan despus de 3 das. Llame a su proveedor de atencin mdica si se presentan sntomas inusuales dentro de las 6 semanas posteriores a la vacunacin. Es posible que todava pueda contraer la gripe, pero la enfermedad no ser tan fuerte como normalmente. No puede contraer la gripe de esta vacuna. La vacuna antigripal no le protege contra resfros u otras enfermedades que pueden causar fiebre. Debe vacunarse cada ao. Qu efectos secundarios puedo tener al utilizar este medicamento? Efectos secundarios que debe informar a su mdico o a su profesional de la salud tan pronto como sea posible: -reacciones alrgicas como erupcin cutnea, picazn o urticarias, hinchazn de la cara, labios o lengua Efectos secundarios que, por lo general, no requieren atencin mdica (debe informarlos a su mdico  o a su profesional de la salud si persisten o si son molestos): -fiebre -dolor de cabeza -molestias y dolores musculares -dolor, sensibilidad, enrojecimiento o hinchazn en el lugar de la inyeccin -cansancio o debilidad Puede ser que esta lista no menciona todos los posibles efectos secundarios. Comunquese a su mdico por asesoramiento mdico sobre los efectos secundarios. Usted puede informar los efectos secundarios a la FDA por telfono al 1-800-FDA-1088. Dnde debo guardar mi medicina? Esta vacuna se administra solamente en clnicas, farmacias, consultorio mdico u otro consultorio de un profesional de la salud y no necesitar guardarlo en su domicilio. ATENCIN: Este folleto es un resumen. Puede ser que no cubra toda la posible informacin. Si usted tiene preguntas acerca de esta medicina, consulte con su mdico, su farmacutico o su profesional de la salud.    2016, Elsevier/Gold Standard. (2009-09-23 15:31:40)  -  

## 2015-12-24 NOTE — Progress Notes (Signed)
Pt is in today to establish care  Pt is in for thyroid problems  Pt confirms pain  Pt states she is taking her medications without complications

## 2015-12-24 NOTE — Progress Notes (Signed)
Crystal Fleming, is a 40 y.o. female  [id number]  [id number]  DOB - 1976-02-28  CC:  Chief Complaint  Patient presents with  . Establish Care  . Thyroid Problem       HPI: Crystal Fleming is a 40 y.o. female here today to establish medical care., Last seen in February 2007 with significant history of hypothyroidism.  She states she is doing okay. Her bacterial vaginitis has since resolved after starting medications last time she was here.   She denies any recent complaints. No loss, weight gain. She is doing well. Denies any signs of depression, sleeping well. Denies any other complaint. She doesn't smoke. She doesn't drink, has 3 older children. Not interested in any more kids at this time.  Patient has No headache, No chest pain, No abdominal pain - No Nausea, No new weakness tingling or numbness, No Cough - SOB.  Interpreter was used to communicate directly with patient for the entire encounter including providing detailed patient instructions.   Lorna Dibble [phone])  Review of Systems: Per hpi, o/w all systems reviewed and negative.   Allergies  Allergen Reactions  . Penicillins    Past Medical History:  Diagnosis Date  . Hypothyroid 2010   Current Outpatient Prescriptions on File Prior to Visit  Medication Sig Dispense Refill  . levothyroxine (SYNTHROID) 100 MCG tablet Take 1 tablet (100 mcg total) by mouth daily. 90 tablet 0  . metroNIDAZOLE (FLAGYL) 500 MG tablet Take 1 tablet (500 mg total) by mouth 2 (two) times daily. (Patient not taking: Reported on 12/24/2015) 14 tablet 0   No current facility-administered medications on file prior to visit.    No family history on file. Social History   Social History  . Marital status: Single    Spouse name: N/A  . Number of children: N/A  . Years of education: N/A   Occupational History  . Not on file.   Social History Main Topics  . Smoking status: Never Smoker  . Smokeless tobacco: Not  on file  . Alcohol use No  . Drug use: No  . Sexual activity: Not Currently    Birth control/ protection: OCP   Other Topics Concern  . Not on file   Social History Narrative  . No narrative on file    Objective:   Vitals:   12/24/15 1115  BP: 131/86  Pulse: 64  Temp: 97.9 F (36.6 C)    Filed Weights   12/24/15 1115  Weight: 163 lb 9.6 oz (74.2 kg)    BP Readings from Last 3 Encounters:  12/24/15 131/86  05/28/15 128/73  01/29/15 132/83    Physical Exam: Constitutional: Patient appears well-developed and well-nourished. No distress. AAOx3, obese, pleasant,  HENT: Normocephalic, atraumatic, External right and left ear normal. Oropharynx is clear and moist. bilat TMS clear Eyes: Conjunctivae and EOM are normal. PERRL, no scleral icterus. Neck: Normal ROM. Neck supple. No JVD. No tracheal deviation. No thyromegaly. CVS: RRR, S1/S2 +, no murmurs, no gallops, no carotid bruit.  Pulmonary: Effort and breath sounds normal, no stridor, rhonchi, wheezes, rales.  Abdominal: Soft. BS +, obese, no distension, tenderness, rebound or guarding.  Musculoskeletal: Normal range of motion. No edema and no tenderness.  LE: bilat/ no c/c/e, pulses 2+ bilateral. Neuro: Alert.  muscle tone coordination wnl. No cranial nerve deficit grossly. Skin: Skin is warm and dry. No rash noted. Not diaphoretic. No erythema. No pallor. Psychiatric: Normal mood and affect. Behavior, judgment, thought content normal.  Lab Results  Component Value Date   WBC 7.8 11/08/2013   HGB 13.8 11/08/2013   HCT 38.5 11/08/2013   MCV 88.1 11/08/2013   PLT 250 11/08/2013   Lab Results  Component Value Date   CREATININE 0.58 10/25/2012   BUN 9 10/25/2012   NA 138 10/25/2012   K 4.3 10/25/2012   CL 100 10/25/2012   CO2 29 10/25/2012    No results found for: HGBA1C Lipid Panel     Component Value Date/Time   CHOL 211 (H) 10/25/2012 1103   TRIG 207 (H) 10/25/2012 1103   HDL 49 10/25/2012 1103    CHOLHDL 4.3 10/25/2012 1103   VLDL 41 (H) 10/25/2012 1103   LDLCALC 121 (H) 10/25/2012 1103       Dep[facility] screen [facility] 2/9 12/24/2015 05/28/2015 01/29/2015 11/08/2013  Decreased Interest 1 0 0 0  Down, Depressed, Hopeless 0 0 0 0  PHQ - 2 Score 1 0 0 0    Assessment and plan:   1. Hypothyroidism, unspecified hypothyroidism type Will hold off on synthroid refill until labs return. - TSH - T4, Free  2. Health care maintenance - CBC with Differential/Platelet - BASIC METABOLIC PANEL WITH GFR - Lipid Panel - VITAMIN D 25 Hydroxy (Vit-D Deficiency, Fractures)  3. Encounter for screening for HIV - HIV antibody (with reflex)  4. Encounter for immunization - Flu Vaccine QUAD 36+ mos IM - tdap today  Return in about 3 weeks (around 01/14/2016) for papsmear.  The patient was given clear instructions to go to ER or return to medical center if symptoms don't improve, worsen or new problems develop. The patient verbalized understanding. The patient was told to call to get lab results if they haven't heard anything in the next week.    This note has been created with Education officer, environmentalDragon speech recognition software and smart phrase technology. Any transcriptional errors are unintentional.   Pete Glatterawn T Kataleena Holsapple, MD, MBA/MHA [facility] [place], [place] [phone]   12/24/2015, 1:21 PM

## 2015-12-25 LAB — VITAMIN D 25 HYDROXY (VIT D DEFICIENCY, FRACTURES): Vit D, 25-Hydroxy: 18 ng/mL — ABNORMAL LOW (ref 30–100)

## 2015-12-29 ENCOUNTER — Other Ambulatory Visit: Payer: Self-pay | Admitting: Internal Medicine

## 2015-12-29 MED ORDER — PRAVASTATIN SODIUM 20 MG PO TABS: 20.0000 mg | ORAL_TABLET | Freq: Every day | ORAL | 3 refills | Status: DC

## 2015-12-29 MED ORDER — VITAMIN D (ERGOCALCIFEROL) 1.25 MG (50000 UNIT) PO CAPS: 50000.0000 [IU] | ORAL_CAPSULE | ORAL | 0 refills | Status: DC

## 2015-12-29 MED ORDER — LEVOTHYROXINE SODIUM 112 MCG PO TABS: 112.0000 ug | ORAL_TABLET | Freq: Every day | ORAL | 3 refills | Status: DC

## 2015-12-31 ENCOUNTER — Telehealth: Payer: Self-pay | Admitting: Internal Medicine

## 2015-12-31 ENCOUNTER — Telehealth: Payer: Self-pay

## 2015-12-31 NOTE — Telephone Encounter (Signed)
[employer] Amillio Id: [id number] contacted pt to go over lab results pt didn't answer lvm for pt to give me a call at her earliest convenience  

## 2015-12-31 NOTE — Telephone Encounter (Signed)
Patient missed nurse's call and was returning it. Please follow up.

## 2016-01-06 ENCOUNTER — Telehealth: Payer: Self-pay

## 2016-01-06 NOTE — Telephone Encounter (Signed)
[employer] Laura Id: [id number] contacted pt to go over lab results pt is aware of results and doesn't have any questions or concerns 

## 2016-01-07 MED FILL — LEVOTHYROXINE 112 MCG TAB: 112 | 30 days supply | Qty: 30 | Fill #0

## 2016-01-07 MED FILL — PRAVASTATIN NA 20 MG TAB: 20 | 30 days supply | Qty: 30 | Fill #0

## 2016-01-07 MED FILL — VIT D2 1.25 MG (50,000 UNIT: 1.25 MG | 84 days supply | Qty: 12 | Fill #0

## 2016-02-18 MED FILL — LEVOTHYROXINE 112 MCG TAB: 112 | 30 days supply | Qty: 30 | Fill #1

## 2016-03-26 MED FILL — LEVOTHYROXINE 112 MCG TAB: 112 | 30 days supply | Qty: 30 | Fill #2

## 2016-04-20 ENCOUNTER — Ambulatory Visit: Payer: Self-pay | Attending: Internal Medicine

## 2016-05-10 MED FILL — LEVOTHYROXINE 112 MCG TAB: 112 | 30 days supply | Qty: 30 | Fill #3

## 2016-06-02 ENCOUNTER — Ambulatory Visit: Payer: Self-pay | Attending: Internal Medicine | Admitting: Internal Medicine

## 2016-06-09 MED FILL — LEVOTHYROXINE 112 MCG TAB: 112 | 30 days supply | Qty: 30 | Fill #4

## 2016-06-16 ENCOUNTER — Ambulatory Visit: Payer: Self-pay | Attending: Internal Medicine | Admitting: Internal Medicine

## 2016-06-16 VITALS — BP 108/77 | HR 70 | Temp 98.3°F | Resp 18 | Ht 62.0 in | Wt 170.0 lb

## 2016-06-16 DIAGNOSIS — Z79899 Other long term (current) drug therapy: Secondary | ICD-10-CM | POA: Insufficient documentation

## 2016-06-16 DIAGNOSIS — Z124 Encounter for screening for malignant neoplasm of cervix: Secondary | ICD-10-CM

## 2016-06-16 DIAGNOSIS — E039 Hypothyroidism, unspecified: Secondary | ICD-10-CM | POA: Insufficient documentation

## 2016-06-16 DIAGNOSIS — E559 Vitamin D deficiency, unspecified: Secondary | ICD-10-CM | POA: Insufficient documentation

## 2016-06-16 DIAGNOSIS — Z131 Encounter for screening for diabetes mellitus: Secondary | ICD-10-CM | POA: Insufficient documentation

## 2016-06-16 DIAGNOSIS — N921 Excessive and frequent menstruation with irregular cycle: Secondary | ICD-10-CM | POA: Insufficient documentation

## 2016-06-16 DIAGNOSIS — Z880 Allergy status to penicillin: Secondary | ICD-10-CM | POA: Insufficient documentation

## 2016-06-16 LAB — CBC WITH DIFFERENTIAL/PLATELET
BASOS ABS: 0 {cells}/uL (ref 0–200)
Basophils Relative: 0 %
EOS ABS: 55 {cells}/uL (ref 15–500)
Eosinophils Relative: 1 %
HCT: 42.3 % (ref 35.0–45.0)
HEMOGLOBIN: 14.4 g/dL (ref 11.7–15.5)
LYMPHS ABS: 2035 {cells}/uL (ref 850–3900)
Lymphocytes Relative: 37 %
MCH: 31.2 pg (ref 27.0–33.0)
MCHC: 34 g/dL (ref 32.0–36.0)
MCV: 91.6 fL (ref 80.0–100.0)
MPV: 9.3 fL (ref 7.5–12.5)
Monocytes Absolute: 385 cells/uL (ref 200–950)
Monocytes Relative: 7 %
NEUTROS ABS: 3025 {cells}/uL (ref 1500–7800)
Neutrophils Relative %: 55 %
Platelets: 294 10*3/uL (ref 140–400)
RBC: 4.62 MIL/uL (ref 3.80–5.10)
RDW: 13.4 % (ref 11.0–15.0)
WBC: 5.5 10*3/uL (ref 3.8–10.8)

## 2016-06-16 LAB — T4, FREE: Free T4: 1.2 ng/dL (ref 0.8–1.8)

## 2016-06-16 LAB — TSH: TSH: 3.54 mIU/L

## 2016-06-16 LAB — POCT GLYCOSYLATED HEMOGLOBIN (HGB A1C): HEMOGLOBIN A1C: 5.5

## 2016-06-16 NOTE — Progress Notes (Signed)
Patient is here for f/up thyroid & PAP  Patient complains about left lower stomach pain that comes and goes

## 2016-06-16 NOTE — Patient Instructions (Signed)
Menorragia (Menorrhagia) Se llama menorragia a los periodos menstruales abundantes o que duran ms de lo habitual. CUIDADOS EN EL HOGAR  Slo tome los medicamentos que le haya indicado el mdico.  Tome los comprimidos de hierro segn las indicaciones del mdico. El sangrado abundante puede causar bajos niveles de hierro en el organismo.  Notome aspirina desde 1 semana antes ni durante el perodo menstrual. La aspirina puede hacer que la hemorragia empeore.  Recustese un rato si debe cambiar el tampn o el apsito ms de una vez en 2 horas. Esto puede ayudar a Warehouse manager.  Consuma una dieta saludable y alimentos ricos en hierro. Entre estos alimentos se incluyen vegetales de [place], carne, [place], [place] y panes y Medical laboratory scientific officer de grano entero.  No trate de perder peso. Espere hasta que la hemorragia se haya detenido y sus niveles de hierro vuelvan a ser normales. SOLICITE AYUDA SI:  Empapa un tampn o un apsito cada 1 o 2 horas, y [employer] ocurre cada vez que tiene el perodo.  Necesita usar apsitos y tampones al mismo tiempo porque pierde [employer].  Debe cambiarse el apsito o el tampn durante la noche.  Tiene un perodo que dura ms de 8 das.  Elimina cogulos ms grandes que 1 pulgada (2,5 cm).  Tiene perodos irregulares que ocurren ms o menos de una vez al mes.  Se siente mareada o se desvanece (se desmaya).  Se siente muy dbil o cansada.  Le falta el aire o siente que el corazn late muy rpido al hacer ejercicios.  Tiene ganas de vomitar ( nuseas ) y devuelve ( vomita ) mientras est tomando el medicamento.  Tiene heces acuosas (diarrea) mientras toma los medicamentos.  Tiene algn problema que pueda relacionarse con el medicamento que est tomando. SOLICITE AYUDA DE INMEDIATO SI:  Empapa 4 o ms apsitos o tampones en 2 horas.  Tiene sangrado y est embarazada. ASEGRESE DE QUE:  Comprende estas instrucciones.  Controlar su  afeccin.  Recibir ayuda de inmediato si no mejora o si empeora. Esta informacin no tiene Theme park manager el consejo del mdico. Asegrese de hacerle al mdico cualquier pregunta que tenga. Document Released: 04/24/2010 Document Revised: 11/22/2012 Document Reviewed: 09/21/2012 Elsevier Interactive Patient Education  2017 [name].  -   Hipotiroidismo (Hypothyroidism) El hipotiroidismo es un trastorno de la tiroides, una glndula grande ubicada en la parte anterior e inferior del cuello. La tiroides Nature conservation officer hormonas que controlan el funcionamiento del [place]. En los casos de hipotiroidismo, la glndula no produce la cantidad suficiente de estas hormonas. CAUSAS Las causas del hipotiroidismo pueden incluir lo siguiente:  Infecciones virales.  Embarazo.  Un ataque del sistema de defensa (sistema inmunitario) a la tiroides.  Algunos medicamentos.  Defectos de nacimiento.  Radioterapias anteriores en la cabeza o el cuello.  Tratamiento previo con yodo radioactivo.  Extirpacin quirrgica previa de una parte o de toda la tiroides.  Problemas con la glndula ubicada en el centro del cerebro (hipfisis). SIGNOS Y SNTOMAS Los signos y los sntomas de hipotiroidismo pueden ser los siguientes:  Sensacin de falta de Engineer, drilling ([place]).  Incapacidad para tolerar el fro.  Aumento de peso que no puede explicarse por un cambio en la dieta o en los hbitos de ejercicio fsico.  Piel seca.  Pelo grueso.  Irregularidades menstruales.  Ralentizacin de los procesos de pensamiento.  Estreimiento.  Tristeza o depresin. DIAGNSTICO [name] puede diagnosticar el hipotiroidismo con anlisis de sangre y ecografas. TRATAMIENTO El hipotiroidismo se  trata con medicamentos que reemplazan las hormonas que el cuerpo no produce. Despus de Microbiologistcomenzar el tratamiento, pueden pasar varias semanas hasta la desaparicin de los sntomas. INSTRUCCIONES PARA EL CUIDADO EN EL  HOGAR  Tome los medicamentos solamente como se lo haya indicado el mdico.  Si empieza a tomar medicamentos nuevos, infrmele al mdico.  Concurra a todas las visitas de control como se lo haya indicado el mdico. Esto es importante. A medida que la enfermedad mejora, es posible que haya que modificar las dosis. Tendr que hacerse anlisis de sangre peridicamente, de modo que el mdico pueda controlar la enfermedad. SOLICITE ATENCIN MDICA SI:  Los sntomas no mejoran con Scientist, research (medical)el tratamiento.  Est tomando medicamentos sustitutivos de la tiroides y:  Artistuda en exceso.  Siente temblores.  Est ansioso.  Baja de peso rpidamente.  No puede Patent examinertolerar el calor.  Tiene cambios emocionales.  Tiene diarrea.  Se siente dbil. SOLICITE ATENCIN MDICA DE INMEDIATO SI:  Siente dolor en el pecho.  Tiene latidos cardacos irregulares o siente dolor en el pecho.  Nota que la frecuencia cardaca est acelerada. Esta informacin no tiene Theme park managercomo fin reemplazar el consejo del mdico. Asegrese de hacerle al mdico cualquier pregunta que tenga. Document Released: 03/22/2005 Document Revised: 04/12/2014 Document Reviewed: 08/07/2013 Elsevier Interactive Patient Education  2017 [employer]

## 2016-06-16 NOTE — Progress Notes (Signed)
Crystal Fleming, is a 41 y.o. female  [id number]  [id number]  DOB - 04/03/1976  Chief Complaint  Patient presents with  . Establish Care        Subjective:   Crystal Fleming is a 41 y.o. female here today for a follow up visit for hypothyroidism. Has been eating more lately and hungry all the time, worried her thyroid is off.  Denies abnml nipple/breast discharge.  Sexually actively  W/ 1 partner, not worried about stds. Of note, states she gets her menses q2wks, and has breast and ovarian tenderness prior /after menses. Menses typically run 5-7 days, and about to start menses again.   Patient has No headache, No chest pain, No abdominal pain - No Nausea, No new weakness tingling or numbness, No Cough - SOB.  No problems updated.  ALLERGIES: Allergies  Allergen Reactions  . Penicillins     PAST MEDICAL HISTORY: Past Medical History:  Diagnosis Date  . Hypothyroid 2010    MEDICATIONS AT HOME: Prior to Admission medications   Medication Sig Start Date End Date Taking? Authorizing Provider  levothyroxine (SYNTHROID, LEVOTHROID) 112 MCG tablet Take 1 tablet (112 mcg total) by mouth daily. 12/29/15  Yes Pete Glatterawn T Kimyatta Lecy, MD  Vitamin D, Ergocalciferol, (DRISDOL) 50000 units CAPS capsule Take 1 capsule (50,000 Units total) by mouth every 7 (seven) days. 12/29/15  Yes Pete Glatterawn T Tandy Grawe, MD  metroNIDAZOLE (FLAGYL) 500 MG tablet Take 1 tablet (500 mg total) by mouth 2 (two) times daily. Patient not taking: Reported on 12/24/2015 05/28/15   Ambrose FinlandValerie A Keck, NP  pravastatin (PRAVACHOL) 20 MG tablet Take 1 tablet (20 mg total) by mouth daily. Patient not taking: Reported on 06/16/2016 12/29/15   Pete Glatterawn T Saba Gomm, MD     Objective:   Vitals:   06/16/16 0907  BP: 108/77  Pulse: 70  Resp: 18  Temp: 98.3 F (36.8 C)  TempSrc: Oral  SpO2: 100%  Weight: 170 lb (77.1 kg)  Height: 5\' 2"  (1.575 m)   cma assisted w/ women exam.  Exam General appearance :  Awake, alert, not in any distress. Speech Clear. Not toxic looking, pleasant. Obese. HEENT: Atraumatic and Normocephalic, pupils equally reactive to light. Neck: supple, no JVD. No cervical lymphadenopathy.  Chest:Good air entry bilaterally, no added sounds. Breast /axilla: bilat nml appearance, not dippling noted. No palpable masses/nodules/nipple discharge noted on exam  CVS: S1 S2 regular, no murmurs/gallups or rubs. Abdomen: Bowel sounds active, Non tender and not distended with no gaurding, rigidity or rebound. Pelvic Exam: Cervix normal in appearance, signs of imminent menses w/ easy bleeding on exam, external genitalia normal, no adnexal masses or tenderness, no cervical motion tenderness, rectovaginal septum normal, uterus normal size, shape, and consistency and vagina normal with white mucoid discharge.   Extremities: B/L Lower Ext shows no edema, both legs are warm to touch Neurology: Awake alert, and oriented X 3, CN II-XII grossly intact, Non focal Skin:No Rash  Data Review Lab Results  Component Value Date   HGBA1C 5.5 06/16/2016    Dep[facility] screen [facility] 2/9 06/16/2016 12/24/2015 05/28/2015 01/29/2015 11/08/2013  Decreased Interest 1 1 0 0 0  Down, Depressed, Hopeless 0 0 0 0 0  PHQ - 2 Score 1 1 0 0 0  Altered sleeping 0 - - - -  Tired, decreased energy 1 - - - -  Change in appetite 1 - - - -  Feeling bad or failure about yourself  0 - - - -  Trouble concentrating  0 - - - -  Moving slowly or fidgety/restless 0 - - - -  Suicidal thoughts 0 - - - -  PHQ-9 Score 3 - - - -      Assessment & Plan   1. Hypothyroidism, unspecified type rechk levels prior to renewing rx. - TSH - T4, Free  2. Vitamin D deficiency - VITAMIN D 25 Hydroxy (Vit-D Deficiency, Fractures)  3. Diabetes mellitus screening - HgB A1c 5.5  4. Menorrhagia with irregular cycle - uterine fibroid?  - US Pelvis Complete; Future - US Transvaginal Non-OB; Future - CBC with Differential -  FSH/LH  5. Pap smear for cervical cancer screening - Cytology - PAP  6. Language barrier.  Interpreter was used to communicate directly with patient for the entire encounter including providing detailed patient instructions.    Patient have been counseled extensively about nutrition and exercise  Return in about 3 months (around 09/16/2016), or if symptoms worsen or fail to improve.  The patient was given clear instructions to go to ER or return to medical center if symptoms don't improve, worsen or new problems develop. The patient verbalized understanding. The patient was told to call to get lab results if they haven't heard anything in the next week.   This note has been created with Education officer, environmental. Any transcriptional errors are unintentional.   Pete Glatter, MD, MBA/MHA [facility] [place], [name] [phone]   06/16/2016, 10:36 AM

## 2016-06-17 LAB — VITAMIN D 25 HYDROXY (VIT D DEFICIENCY, FRACTURES): VIT D 25 HYDROXY: 21 ng/mL — AB (ref 30–100)

## 2016-06-17 LAB — FSH/LH
FSH: 5.9 m[IU]/mL
LH: 14.1 m[IU]/mL

## 2016-06-17 LAB — CERVICOVAGINAL ANCILLARY ONLY: Wet Prep (BD Affirm): NEGATIVE

## 2016-06-21 ENCOUNTER — Other Ambulatory Visit: Payer: Self-pay | Admitting: Internal Medicine

## 2016-06-21 LAB — CYTOLOGY - PAP
Diagnosis: NEGATIVE
HPV: NOT DETECTED

## 2016-06-21 MED ORDER — LEVOTHYROXINE SODIUM 112 MCG PO TABS: 112.0000 ug | ORAL_TABLET | Freq: Every day | ORAL | 3 refills | Status: DC

## 2016-06-22 ENCOUNTER — Telehealth: Payer: Self-pay

## 2016-06-22 NOTE — Telephone Encounter (Signed)
-----   Message from Pete Glatterawn T Langeland, MD sent at 06/21/2016  1:23 PM EDT ----- Pt is not menopausal per labs. Vit d still low, but better overall.  Take OTC vit D 5,000 IU /daily for bone health.  Thyroid levels good, renewed same dose synthroid. Blood count nml. papsmear nml, repeat in 3 years.   No bv noted on labs.

## 2016-06-22 NOTE — Telephone Encounter (Signed)
CMA call to inform patient about lab & pap results

## 2016-06-24 ENCOUNTER — Ambulatory Visit (HOSPITAL_COMMUNITY)
Admission: RE | Admit: 2016-06-24 | Discharge: 2016-06-24 | Disposition: A | Discharge status: Home / Self Care | Payer: Self-pay | Source: Ambulatory Visit | Attending: Internal Medicine | Admitting: Internal Medicine

## 2016-06-24 DIAGNOSIS — N921 Excessive and frequent menstruation with irregular cycle: Secondary | ICD-10-CM | POA: Insufficient documentation

## 2016-06-24 DIAGNOSIS — N8312 Corpus luteum cyst of left ovary: Secondary | ICD-10-CM | POA: Insufficient documentation

## 2016-06-24 DIAGNOSIS — N888 Other specified noninflammatory disorders of cervix uteri: Secondary | ICD-10-CM | POA: Insufficient documentation

## 2016-07-13 MED FILL — ?LEVOTHYROXINE 112 MCG TAB: 112 | 30 days supply | Qty: 30 | Fill #5

## 2016-08-19 ENCOUNTER — Encounter: Payer: Self-pay | Admitting: Internal Medicine

## 2016-08-20 ENCOUNTER — Encounter: Payer: Self-pay | Admitting: Internal Medicine

## 2016-08-23 ENCOUNTER — Encounter: Payer: Self-pay | Admitting: Internal Medicine

## 2016-08-23 MED FILL — LEVOTHYROXINE 112 MCG TAB: 112 | 30 days supply | Qty: 30 | Fill #6

## 2016-09-15 ENCOUNTER — Ambulatory Visit: Payer: Self-pay | Attending: Family Medicine | Admitting: Family Medicine

## 2016-09-15 ENCOUNTER — Encounter: Payer: Self-pay | Admitting: Family Medicine

## 2016-09-15 VITALS — BP 113/75 | HR 78 | Temp 98.6°F | Resp 18 | Ht 62.0 in | Wt 167.4 lb

## 2016-09-15 DIAGNOSIS — E559 Vitamin D deficiency, unspecified: Secondary | ICD-10-CM | POA: Insufficient documentation

## 2016-09-15 DIAGNOSIS — E039 Hypothyroidism, unspecified: Secondary | ICD-10-CM | POA: Insufficient documentation

## 2016-09-15 DIAGNOSIS — E785 Hyperlipidemia, unspecified: Secondary | ICD-10-CM | POA: Insufficient documentation

## 2016-09-15 DIAGNOSIS — Z8639 Personal history of other endocrine, nutritional and metabolic disease: Secondary | ICD-10-CM

## 2016-09-15 DIAGNOSIS — Z79899 Other long term (current) drug therapy: Secondary | ICD-10-CM | POA: Insufficient documentation

## 2016-09-15 NOTE — Progress Notes (Signed)
Patient is here for f/up  

## 2016-09-15 NOTE — Patient Instructions (Addendum)
Hipotiroidismo (Hypothyroidism) El hipotiroidismo es un trastorno de la tiroides, una glndula grande ubicada en la parte anterior e inferior del cuello. La tiroides Nature conservation officer hormonas que controlan el funcionamiento del [place]. En los casos de hipotiroidismo, la glndula no produce la cantidad suficiente de estas hormonas. CAUSAS Las causas del hipotiroidismo pueden incluir lo siguiente:  Infecciones virales.  Embarazo.  Un ataque del sistema de defensa (sistema inmunitario) a la tiroides.  Algunos medicamentos.  Defectos de nacimiento.  Radioterapias anteriores en la cabeza o el cuello.  Tratamiento previo con yodo radioactivo.  Extirpacin quirrgica previa de una parte o de toda la tiroides.  Problemas con la glndula ubicada en el centro del cerebro (hipfisis). SIGNOS Y SNTOMAS Los signos y los sntomas de hipotiroidismo pueden ser los siguientes:  Sensacin de falta de Engineer, drilling ([place]).  Incapacidad para tolerar el fro.  Aumento de peso que no puede explicarse por un cambio en la dieta o en los hbitos de ejercicio fsico.  Piel seca.  Pelo grueso.  Irregularidades menstruales.  Ralentizacin de los procesos de pensamiento.  Estreimiento.  Tristeza o depresin. DIAGNSTICO [name] puede diagnosticar el hipotiroidismo con anlisis de sangre y ecografas. TRATAMIENTO El hipotiroidismo se trata con medicamentos que reemplazan las hormonas que el cuerpo no produce. Despus de Microbiologist, pueden pasar varias semanas hasta la desaparicin de los sntomas. INSTRUCCIONES PARA EL CUIDADO EN EL HOGAR  Tome los medicamentos solamente como se lo haya indicado el mdico.  Si empieza a tomar medicamentos nuevos, infrmele al mdico.  Concurra a todas las visitas de control como se lo haya indicado el mdico. Esto es importante. A medida que la enfermedad mejora, es posible que haya que modificar las dosis. Tendr que hacerse anlisis de sangre  peridicamente, de modo que el mdico pueda controlar la enfermedad.  SOLICITE ATENCIN MDICA SI:  Los sntomas no mejoran con Scientist, research (medical).  Est tomando medicamentos sustitutivos de la tiroides y: ? Artist. ? Siente temblores. ? Est ansioso. ? Baja de peso rpidamente. ? No puede Patent examiner. ? Tiene cambios emocionales. ? Tiene diarrea. ? Se siente dbil.  SOLICITE ATENCIN MDICA DE INMEDIATO SI:  Siente dolor en el pecho.  Tiene latidos cardacos irregulares o siente dolor en el pecho.  Nota que la frecuencia cardaca est acelerada.  Esta informacin no tiene Theme park manager el consejo del mdico. Asegrese de hacerle al mdico cualquier pregunta que tenga. Document Released: 03/22/2005 Document Revised: 04/12/2014 Document Reviewed: 08/07/2013 Elsevier Interactive Patient Education  2017 Elsevier Inc.  Comer de manera saludable con un presupuesto ajustado (Eating Healthy on a Budget) Hay muchas formas de ahorrar dinero en la tienda de comestibles y seguir comiendo de manera saludable. Usted puede lograrlo si planifica las comidas en funcin de su presupuesto, hace las compras de acuerdo con su presupuesto y lo que necesite, y prepara usted mismo las comidas. Cmo puedo comprar ms alimentos con un presupuesto limitado? Planificacin  Planifique las comidas y las colaciones de acuerdo con su lista de compras y su presupuesto.  Busque recetas que le permitan cocinar una vez y preparar una cantidad que alcance para dos comidas.  Incluya comidas, como guisos, cazuelas y [employer], que harn rendir los alimentos ms costosos.  Haga una lista de compras y llvela a la tienda. Si tiene un telfono inteligente, podra usarlo para crear su lista de compras. Compras  Cuando compre comestibles, adquiera solo los productos que figuran en la lista y vaya nicamente a las  secciones de la tienda donde estn los productos que incluyo en  ella. Preparacin  Algunas comidas se pueden preparar con antelacin. Cocine de Centex Corporation de [place] extra.  Prepare comida de ms (por ejemplo, duplique las recetas) y congele lo que sobra en recipientes para una racin o en porciones individuales para comidas rpidas y colaciones.  Use las sobras en el plan de alimentacin de la semana.  Pruebe algunos platillos sin carne o comidas que no requieran coccin, como las ensaladas.  Cuando regrese a su casa de la tienda de comestibles, lave y prepare las frutas y las verduras, de modo que estn listas para usarlas y comerlas. Esto ayudar a reducir el desperdicio de la comida. Cmo puedo comprar ms alimentos con un presupuesto limitado? Ponga en prctica estos consejos la prxima vez que vaya de compras:  Compre las [address] de la tienda o las [employer].  Use cupones solo para los alimentos y las marcas que compra habitualmente. No adquiera productos que no comprara normalmente solo porque estn en oferta.  Busque las [employer] en Internet y en los peridicos.  Compre productos saludables a granel cuando estn disponibles, por ejemplo, hierbas, especias, harinas, pastas, frutos secos y frutas disecadas.  Compre las frutas y las verduras de estacin. Generalmente, los precios son ms bajos cuando los productos son de [place].  Compare distintos productos y busque las diferencias que hay entre ellos. Para hacerlo, mire el precio por unidad que figura en la etiqueta y selo para comparar diferentes marcas y [place], y Chief Strategy Officer cul es la mejor opcin.  Elija los productos saludables que normalmente son econmicos, como las zanahorias, las papas, las [place], las bananas y las naranjas. Los porotos secos o enlatados son Neomia Dear fuente econmica de protenas.  Compre a granel y Kellogg extra. Entre los productos que puede comprar a granel se incluyen las carnes, el pescado, las aves, las frutas  y las verduras congeladas.  Limite la compra de alimentos preparados o listos para consumir, como las frutas y las verduras previamente cortadas, y las Proofreader.  Si es posible, compre en diferentes lugares para descubrir cul es la tienda de comestibles que ofrece los mejores precios. Algunas tiendas cobran mucho ms que otras por los mismos productos.  No haga compras cuando tenga apetito. Si lo hace, puede ser difcil atenerse a la lista y al presupuesto.  Atngase a la lista y Hydrologist a las compras compulsivas. Considere a Musician oficial para la semana.  Para que la compra de verduras y frutas sea variada, opte por los productos frescos, congelados y [place].  Mire ms all de lo que tiene a nivel de Wellsite geologist. Los ConocoPhillips estn a nivel de la vista (de un adulto o un nio) son ms costosos. Busque las State [employer] estantes inferiores y superiores.  Cuando haga compras, no pierda tiempo, ya que cuanto ms tiempo pase en la tienda, [employer] sern las probabilidades de que gaste ms dinero.  Considere la posibilidad de visitar otras tiendas minoristas, por ejemplo, tiendas de descuentos, grandes [address], puestos locales de frutas y verduras, y Teacher, early years/pre de Event organiser. Cules son algunos consejos respecto de los sustitutos de los alimentos menos costosos? Cuando elija alimentos ms costosos, como carnes y productos lcteos, ponga en prctica estos consejos para ahorrar dinero:  Opte por los cortes de carne ms econmicos, como los muslos y las patas de pollo con Personnel officer, Teacher, English as a foreign language del pollo deshuesado  sin piel. Cuando est listo para preparar el pollo, puede retirar usted mismo la piel para que sea ms saludable.  Escoja las carnes [place]magras, como el pollo o [place] Valleyel pavo. Cuando elija carne picada, asegrese de que sea [place] Springsmagra (92% de carne magra, 8% de grasa). Si compra carne picada ms grasosa, escurra la grasa antes de comerla.  Compre porotos y  guisantes secos, como lentejas, arvejas partidas o frijoles colorados.  Para los frutos de mar, opte por el atn, el salmn o las sardinas en lata.  Los huevos son Neomia Dearuna fuente econmica de protenas.  Compre los envases ms grandes de Pharmacologistyogur en lugar de los recipientes de porciones individuales.  Opte por el agua en lugar de los refrescos y otras bebidas azucaradas.  No compre papas fritas, galletas y Liechtensteinotra comida Sports administratorchatarra. Estos productos suelen ser costosos, tienen muchas caloras y un bajo valor nutritivo. Cmo puedo preparar los alimentos que compro del modo ms saludable? Ponga en prctica estos consejos para cocinar los alimentos del modo ms saludable para reducir el consumo excesivo de grasas y caloras:  Cocine al vapor, saltee, ase u hornee los Publishing rights manageralimentos en lugar de frerlos.  Asegrese de que la mitad del plato est ocupada por frutas o verduras. Elija entre las frutas y las verduras frescas, congeladas o Dowlingenlatadas. Si opta por las enlatadas, enjuguelas antes de comerlas. Esto eliminar el exceso de sal que se agrega para envasarlas.  Retire toda la grasa de la carne antes de cocinarla. Quite la piel del pollo o del pavo.  Con Jonathon Jordanuna cuchara, retire la grasa de las preparaciones con carne una vez que se hayan enfriado en el refrigerador y la grasa se haya solidificado en la parte superior.  Cuando prepare consoms, sopas o flanes, use PPG Industriesleche descremada, con bajo contenido de grasa o The First Americanleche descremada evaporada.  Cuando prepare salsa para untar y alios, sustituya la crema agria y la Dupontmayonesa por yogur con bajo contenido de Eden Rocgrasa, crema agria o queso cottage.  Haga la prueba de condimentar la comida con jugo de limn, hierbas o especias en lugar de hacerlo con sal, mantequilla o margarina. Esta informacin no tiene Theme park managercomo fin reemplazar el consejo del mdico. Asegrese de hacerle al mdico cualquier pregunta que tenga. Document Released: 01/04/2014 Document Revised: 01/04/2014 Document  Reviewed: 10/23/2013 Elsevier Interactive Patient Education  [employer]2018 Elsevier Inc.

## 2016-09-15 NOTE — Progress Notes (Signed)
   Subjective:  Patient ID: Crystal Fleming, female    DOB: 07/16/1975  Age: 41 y.o. MRN: [id number]  CC: Follow-up   HPI Crystal Fleming 41 y.o female PHM of hypothyroidism, HLD, and vitamin d deficiency presents for follow up evaluation of thyroid function. She denies fatigue, weight changes, heat/cold intolerance, bowel/skin changes or CVS symptoms. Previous thyroid studies include TSH. She reports completing course of vitamin d supplement. History of dyslipidema in the past. She denies taking any medication for management.   Outpatient Medications Prior to Visit  Medication Sig Dispense Refill  . levothyroxine (SYNTHROID, LEVOTHROID) 112 MCG tablet Take 1 tablet (112 mcg total) by mouth daily. 90 tablet 3  . pravastatin (PRAVACHOL) 20 MG tablet Take 1 tablet (20 mg total) by mouth daily. (Patient not taking: Reported on 06/16/2016) 90 tablet 3  . metroNIDAZOLE (FLAGYL) 500 MG tablet Take 1 tablet (500 mg total) by mouth 2 (two) times daily. (Patient not taking: Reported on 12/24/2015) 14 tablet 0  . Vitamin D, Ergocalciferol, (DRISDOL) 50000 units CAPS capsule Take 1 capsule (50,000 Units total) by mouth every 7 (seven) days. 12 capsule 0   No facility-administered medications prior to visit.     ROS Review of Systems  Constitutional: Negative.   HENT: Negative.   Eyes: Negative.   Respiratory: Negative.   Cardiovascular: Negative.   Endocrine: Negative.   Skin: Negative.   Neurological: Negative.   Psychiatric/Behavioral: Negative.    Objective:  BP 113/75 (BP Location: Left Arm, Patient Position: Sitting, Cuff Size: Normal)   Pulse 78   Temp 98.6 F (37 C) (Oral)   Resp 18   Ht 5\' 2"  (1.575 m)   Wt 167 lb 6.4 oz (75.9 kg)   SpO2 98%   BMI 30.62 kg/m   BP/Weight 09/15/2016 06/16/2016 12/24/2015  Systolic BP 113 108 131  Diastolic BP 75 77 86  Wt. (Lbs) 167.4 170 163.6  BMI 30.62 31.09 29.92    Physical Exam  Constitutional: She appears  well-developed.  Eyes: Conjunctivae are normal. Pupils are equal, round, and reactive to light.  Neck: No JVD present.  Cardiovascular: Normal rate, regular rhythm, normal heart sounds and intact distal pulses.   Pulmonary/Chest: Effort normal and breath sounds normal.  Skin: Skin is warm and dry.  Nursing note and vitals reviewed.  Assessment & Plan:   Problem List Items Addressed This Visit      Endocrine   Hypothyroidism - Primary   Will recheck thyroid function and then evaluate levothyroxine dosage.   Relevant Orders   TSH    Other Visit Diagnoses    History of vitamin D deficiency       Relevant Orders   Vitamin D, 25-hydroxy   Dyslipidemia       Relevant Orders   Lipid Panel      No orders of the defined types were placed in this encounter.   Follow-up: Return in about 8 weeks (around 11/10/2016) for Hypothyroidism.   Lizbeth BarkMandesia R Hairston FNP

## 2016-09-16 LAB — VITAMIN D 25 HYDROXY (VIT D DEFICIENCY, FRACTURES): VIT D 25 HYDROXY: 19.8 ng/mL — AB (ref 30.0–100.0)

## 2016-09-16 LAB — LIPID PANEL
Chol/HDL Ratio: 4.6 ratio — ABNORMAL HIGH (ref 0.0–4.4)
Cholesterol, Total: 193 mg/dL (ref 100–199)
HDL: 42 mg/dL (ref >39)
LDL Calculated: 111 mg/dL — ABNORMAL HIGH (ref 0–99)
TRIGLYCERIDES: 200 mg/dL — AB (ref 0–149)
VLDL Cholesterol Cal: 40 mg/dL (ref 5–40)

## 2016-09-16 LAB — TSH: TSH: 2.91 u[IU]/mL (ref 0.450–4.500)

## 2016-09-17 ENCOUNTER — Telehealth: Payer: Self-pay

## 2016-09-17 ENCOUNTER — Other Ambulatory Visit: Payer: Self-pay | Admitting: Family Medicine

## 2016-09-17 DIAGNOSIS — E039 Hypothyroidism, unspecified: Secondary | ICD-10-CM

## 2016-09-17 DIAGNOSIS — E559 Vitamin D deficiency, unspecified: Secondary | ICD-10-CM

## 2016-09-17 DIAGNOSIS — E782 Mixed hyperlipidemia: Secondary | ICD-10-CM

## 2016-09-17 MED ORDER — PRAVASTATIN SODIUM 20 MG PO TABS: 20.0000 mg | ORAL_TABLET | Freq: Every day | ORAL | 2 refills | Status: DC

## 2016-09-17 MED ORDER — LEVOTHYROXINE SODIUM 112 MCG PO TABS: 112.0000 ug | ORAL_TABLET | Freq: Every day | ORAL | 0 refills | Status: DC

## 2016-09-17 MED ORDER — VITAMIN D (ERGOCALCIFEROL) 1.25 MG (50000 UNIT) PO CAPS: 50000.0000 [IU] | ORAL_CAPSULE | ORAL | 0 refills | Status: AC

## 2016-09-17 NOTE — Telephone Encounter (Signed)
-----   Message from Lizbeth BarkMandesia R Hairston, FNP sent at 09/17/2016  7:46 AM EDT ----- Thyroid function normal on current levothyroxine dosage, will maintain current dosage. Recommend follow up in 3 months. -Lipid levels were elevated. This can increase your risk of heart disease. You will be prescribed pravastatin to help lower risk. Recommend follow up in 3 months.  Vitamin D level was low. Vitamin D helps to keep bones strong. You were prescribed ergocalciferol (capsules) to increase your vitamin-d level. Once finished start taking OTC vitamin d supplement with 800 international units (IU) of vitamin-d per day. Recommend follow up in 3 months.

## 2016-09-17 NOTE — Telephone Encounter (Signed)
CMA call regarding lab results   Patient Verify DOB   Patient was aware and understood  

## 2016-10-13 ENCOUNTER — Ambulatory Visit: Payer: Self-pay | Attending: Family Medicine

## 2016-11-10 ENCOUNTER — Encounter: Payer: Self-pay | Admitting: Family Medicine

## 2016-11-10 ENCOUNTER — Ambulatory Visit: Payer: Self-pay | Attending: Family Medicine | Admitting: Family Medicine

## 2016-11-10 VITALS — BP 104/67 | HR 69 | Temp 98.6°F | Resp 18 | Ht 62.0 in | Wt 168.0 lb

## 2016-11-10 DIAGNOSIS — E039 Hypothyroidism, unspecified: Secondary | ICD-10-CM | POA: Insufficient documentation

## 2016-11-10 DIAGNOSIS — Z79899 Other long term (current) drug therapy: Secondary | ICD-10-CM | POA: Insufficient documentation

## 2016-11-10 DIAGNOSIS — E782 Mixed hyperlipidemia: Secondary | ICD-10-CM | POA: Insufficient documentation

## 2016-11-10 DIAGNOSIS — R102 Pelvic and perineal pain: Secondary | ICD-10-CM | POA: Insufficient documentation

## 2016-11-10 LAB — POCT URINALYSIS DIPSTICK
Glucose, UA: NEGATIVE
LEUKOCYTES UA: NEGATIVE
Nitrite, UA: NEGATIVE
PH UA: 6 (ref 5.0–8.0)
Protein, UA: 30
RBC UA: NEGATIVE
Spec Grav, UA: 1.02 (ref 1.010–1.025)
Urobilinogen, UA: 1 E.U./dL

## 2016-11-10 LAB — POCT URINE PREGNANCY: Preg Test, Ur: NEGATIVE

## 2016-11-10 MED ORDER — IBUPROFEN 600 MG PO TABS
600.0000 mg | ORAL_TABLET | Freq: Three times a day (TID) | ORAL | 0 refills | Status: DC | PRN
Start: 2016-11-10 — End: 2017-08-03

## 2016-11-10 MED FILL — IBUPROFEN 600 MG TABLET: 600 | 10 days supply | Qty: 30 | Fill #0

## 2016-11-10 NOTE — Progress Notes (Signed)
Patient is here for f/up   Patient complains about pelvic area pain

## 2016-11-10 NOTE — Progress Notes (Signed)
Subjective:  Patient ID: Crystal Fleming, female    DOB: 07/14/1975  Age: 41 y.o. MRN: [id number]  CC: Follow-up  Interpreter services used: Gershon Cullriscilla [phone] ; Alba Corylsa [phone]  HPI [name] presents for pelvic pain. The pain is described as aching.Onset was 3 days ago. Symptoms have been unchanged since. Aggravating factors: none.  Alleviating factors: none. Associated symptoms: none. The patient denies dysuria and discharge, or lesions. LPM was 10/27/16. PHM of hypothyroidism, HLD. She denies fatigue, weight changes, heat/cold intolerance, bowel/skin changes or CVS symptoms. Previous thyroid studies include TSH. History of mixed HLD. She reports taking pravastatin daily.     Outpatient Medications Prior to Visit  Medication Sig Dispense Refill  . levothyroxine (SYNTHROID, LEVOTHROID) 112 MCG tablet Take 1 tablet (112 mcg total) by mouth daily. 90 tablet 0  . pravastatin (PRAVACHOL) 20 MG tablet Take 1 tablet (20 mg total) by mouth daily. 30 tablet 2   No facility-administered medications prior to visit.     ROS Review of Systems  Constitutional: Negative.   Eyes: Negative.   Respiratory: Negative.   Cardiovascular: Negative.   Gastrointestinal: Negative.   Endocrine: Negative.   Genitourinary: Positive for pelvic pain. Negative for dysuria and vaginal discharge.  Skin: Negative.     Objective:  BP 104/67 (BP Location: Left Arm, Patient Position: Sitting, Cuff Size: Normal)   Pulse 69   Temp 98.6 F (37 C) (Oral)   Resp 18   Ht 5\' 2"  (1.575 m)   Wt 168 lb (76.2 kg)   SpO2 99%   BMI 30.73 kg/m   BP/Weight 11/10/2016 09/15/2016 06/16/2016  Systolic BP 104 113 108  Diastolic BP 67 75 77  Wt. (Lbs) 168 167.4 170  BMI 30.73 30.62 31.09     Physical Exam  Constitutional: She appears well-developed and well-nourished.  Eyes: Pupils are equal, round, and reactive to light. Conjunctivae are normal.  Neck: No thyromegaly present.  Cardiovascular:  Normal rate, regular rhythm, normal heart sounds and intact distal pulses.   Pulmonary/Chest: Effort normal and breath sounds normal.  Abdominal: Soft. Bowel sounds are normal. There is tenderness (bilateral  pelvic).  Lymphadenopathy:    She has no cervical adenopathy.  Skin: Skin is warm and dry.  Psychiatric: She has a normal mood and affect.  Nursing note and vitals reviewed.  Assessment & Plan:   Problem List Items Addressed This Visit      Endocrine   Hypothyroidism   Relevant Orders   TSH     Other   Mixed hyperlipidemia - Primary   Relevant Orders   Lipid Panel    Other Visit Diagnoses    Pelvic pain in female       Lab screening test ordered. If symptoms still persist or worsen recommend follow up for imaging    studies.   Relevant Medications   ibuprofen (ADVIL,MOTRIN) 600 MG tablet   Other Relevant Orders   Urinalysis Dipstick (Completed)   Urine cytology ancillary only   POCT urine pregnancy (Completed)      Meds ordered this encounter  Medications  . ibuprofen (ADVIL,MOTRIN) 600 MG tablet    Sig: Take 1 tablet (600 mg total) by mouth every 8 (eight) hours as needed for moderate pain or cramping (Take with food.).    Dispense:  30 tablet    Refill:  0    Order Specific Question:   Supervising Provider    Answer:   Quentin AngstJEGEDE, OLUGBEMIGA E [id number]]    Follow-up: Return in about  3 months (around 02/10/2017), or if symptoms worsen or fail to improve, for Hypothyroidism/HLD.   Lizbeth Bark FNP

## 2016-11-10 NOTE — Patient Instructions (Signed)
Dolor plvico en la mujer (Pelvic Pain, Female) El dolor plvico se percibe en la parte baja del abdomen, por debajo del ombligo y entre las caderas. El dolor puede comenzar de forma repentina (agudo), reaparecer (recurrente) o durar mucho tiempo (crnico). Se considera que el dolor plvico que dura ms de seis meses es crnico. El dolor plvico puede afectar lo siguiente:  Los rganos genitales.  El sistema urinario.  El tubo digestivo.  El sistema musculoesqueltico. El dolor plvico tiene muchas causas posibles. A veces, puede ser el resultado de afecciones digestivas o urinarias, distensiones de msculos o ligamentos, o incluso trastornos genitales. A veces, la causa no se conoce. INSTRUCCIONES PARA EL CUIDADO EN EL HOGAR  Tome los medicamentos de venta libre y los recetados solamente como se lo haya indicado el mdico.  Haga reposo como se lo haya indicado el mdico.  No tenga relaciones sexuales si le causan dolor.  Lleve un registro del dolor plvico. Escriba los siguientes datos: ? Cundo comenz el dolor. ? La ubicacin del dolor. ? Qu cosas parecen aliviar o intensificar el dolor, como los alimentos o la menstruacin. ? Los sntomas que tiene junto con el dolor.  Concurra a todas las visitas de control como se lo haya indicado el mdico. Esto es importante. SOLICITE ATENCIN MDICA SI:  Los medicamentos no le alivian el dolor.  El dolor reaparece.  Aparecen nuevos sntomas.  Tiene secrecin o sangrado vaginal anormal, incluido sangrado despus de la menopausia.  Tiene fiebre o siente escalofros.  Tiene estreimiento.  Observa sangre en la orina o en la materia fecal.  La orina tiene mal olor.  Se siente mareada o dbil. SOLICITE ATENCIN MDICA DE INMEDIATO SI:  Siente dolor intenso repentinamente.  El dolor es cada vez ms intenso.  Tiene dolor intenso junto con fiebre, nuseas, vmitos o exceso de sudoracin.  Pierde la conciencia. Esta  informacin no tiene como fin reemplazar el consejo del mdico. Asegrese de hacerle al mdico cualquier pregunta que tenga. Document Released: 06/18/2008 Document Revised: 08/06/2014 Document Reviewed: 01/10/2015 Elsevier Interactive Patient Education  2018 Elsevier Inc.  

## 2016-11-11 LAB — LIPID PANEL
Chol/HDL Ratio: 4.2 ratio (ref 0.0–4.4)
Cholesterol, Total: 194 mg/dL (ref 100–199)
HDL: 46 mg/dL (ref >39)
LDL Calculated: 123 mg/dL — ABNORMAL HIGH (ref 0–99)
Triglycerides: 127 mg/dL (ref 0–149)
VLDL CHOLESTEROL CAL: 25 mg/dL (ref 5–40)

## 2016-11-11 LAB — URINE CYTOLOGY ANCILLARY ONLY
Chlamydia: NEGATIVE
Neisseria Gonorrhea: NEGATIVE
Trichomonas: NEGATIVE

## 2016-11-11 LAB — TSH: TSH: 3.17 u[IU]/mL (ref 0.450–4.500)

## 2016-11-15 LAB — URINE CYTOLOGY ANCILLARY ONLY
BACTERIAL VAGINITIS: NEGATIVE
CANDIDA VAGINITIS: NEGATIVE

## 2016-11-19 ENCOUNTER — Other Ambulatory Visit: Payer: Self-pay | Admitting: Family Medicine

## 2016-11-19 DIAGNOSIS — E039 Hypothyroidism, unspecified: Secondary | ICD-10-CM

## 2016-11-19 DIAGNOSIS — E782 Mixed hyperlipidemia: Secondary | ICD-10-CM

## 2016-11-19 MED ORDER — LEVOTHYROXINE SODIUM 112 MCG PO TABS: 112.0000 ug | ORAL_TABLET | Freq: Every day | ORAL | 1 refills | Status: DC

## 2016-11-19 MED ORDER — PRAVASTATIN SODIUM 40 MG PO TABS
40.0000 mg | ORAL_TABLET | Freq: Every day | ORAL | 3 refills | Status: DC
Start: 2016-11-19 — End: 2017-12-12

## 2016-11-23 ENCOUNTER — Telehealth: Payer: Self-pay

## 2016-11-23 NOTE — Telephone Encounter (Signed)
CMA call regarding lab results   Patient verify DOB  Patient was aware and understood  

## 2016-11-23 NOTE — Telephone Encounter (Signed)
-----   Message from Lizbeth Bark, FNP sent at 11/19/2016  9:21 AM EDT ----- Thyroid levels are normal on current levothyroxine dosage. Lipid levels have improved from previous labs but is still elevated. Your dosage of pravastatin will be increased. Start eating a diet low in saturated fat. Limit your intake of fried foods, red meats, and whole milk.  Gonorrhea, Chlamydia,BV, Yeast, and Trichomonas were all negative. Follow up if pelvic pain still persists.

## 2017-01-06 ENCOUNTER — Encounter: Payer: Self-pay | Admitting: Physician Assistant

## 2017-01-06 ENCOUNTER — Ambulatory Visit: Payer: Self-pay | Attending: Family Medicine | Admitting: Physician Assistant

## 2017-01-06 VITALS — BP 112/78 | HR 69 | Temp 98.5°F | Resp 18 | Ht 62.0 in | Wt 166.2 lb

## 2017-01-06 DIAGNOSIS — E039 Hypothyroidism, unspecified: Secondary | ICD-10-CM | POA: Insufficient documentation

## 2017-01-06 DIAGNOSIS — Z1231 Encounter for screening mammogram for malignant neoplasm of breast: Secondary | ICD-10-CM

## 2017-01-06 DIAGNOSIS — N644 Mastodynia: Secondary | ICD-10-CM

## 2017-01-06 DIAGNOSIS — Z1239 Encounter for other screening for malignant neoplasm of breast: Secondary | ICD-10-CM

## 2017-01-06 DIAGNOSIS — E785 Hyperlipidemia, unspecified: Secondary | ICD-10-CM | POA: Insufficient documentation

## 2017-01-06 NOTE — Progress Notes (Signed)
Patient ID: Crystal Fleming, female   DOB: 09-10-1975, 41 y.o.   MRN: [id number]   Crystal Fleming, is a 41 y.o. female  [id number]:[id number]  [id number]  DOB - 09-25-1975  Subjective:  Chief Complaint and HPI: Crystal Fleming is a 41 y.o. female here today to for L breast pain X 1 week No f/c.  No palpable mass.  No discharge or skin changes.   LMP: 12/23/2016 BC:  Condoms  No FH breast CA.  Last MMG/U/S about 4 years ago and benign.  "Diego" with [employer] translating.  ROS:   Constitutional:  No f/c, No night sweats, No unexplained weight loss. EENT:  No vision changes, No blurry vision, No hearing changes. No mouth, throat, or ear problems.  Respiratory: No cough, No SOB Cardiac: No CP, no palpitations GI:  No abd pain, No N/V/D. GU: No Urinary s/sx Musculoskeletal: No joint pain Neuro: No headache, no dizziness, no motor weakness.  Skin: No rash Endocrine:  No polydipsia. No polyuria.  Psych: Denies SI/HI  No problems updated.  ALLERGIES: Allergies  Allergen Reactions  . Penicillins     PAST MEDICAL HISTORY: Past Medical History:  Diagnosis Date  . Hypothyroid 2010    MEDICATIONS AT HOME: Prior to Admission medications   Medication Sig Start Date End Date Taking? Authorizing Provider  ibuprofen (ADVIL,MOTRIN) 600 MG tablet Take 1 tablet (600 mg total) by mouth every 8 (eight) hours as needed for moderate pain or cramping (Take with food.). 11/10/16   Lizbeth Bark, FNP  levothyroxine (SYNTHROID, LEVOTHROID) 112 MCG tablet Take 1 tablet (112 mcg total) by mouth daily. 11/19/16   Lizbeth Bark, FNP  pravastatin (PRAVACHOL) 40 MG tablet Take 1 tablet (40 mg total) by mouth daily. 11/19/16   Lizbeth Bark, FNP     Objective:  EXAM:   Vitals:   01/06/17 0854  BP: 112/78  Pulse: 69  Resp: 18  Temp: 98.5 F (36.9 C)  TempSrc: Oral  SpO2: 100%  Weight: 166 lb 3.2 oz (75.4 kg)  Height:  (1.575 m)      General appearance : A&OX3. NAD. Non-toxic-appearing HEENT: Atraumatic and Normocephalic.  PERRLA. EOM intact. Neck: supple, no JVD. No cervical lymphadenopathy. No thyromegaly Chest/Lungs:  Breathing-non-labored, Good air entry bilaterally, breath sounds normal without rales, rhonchi, or wheezing  CVS: S1 S2 regular, no murmurs, gallops, rubs  B breasts and axilla:  No discrete masses or LN Extremities: Bilateral Lower Ext shows no edema, both legs are warm to touch with = pulse throughout Neurology:  CN II-XII grossly intact, Non focal.   Psych:  TP linear. J/I WNL. Normal speech. Appropriate eye contact and affect.  Skin:  No Rash  Data Review Lab Results  Component Value Date   HGBA1C 5.5 06/16/2016     Assessment & Plan   1. Breast cancer screening - MM Digital Screening; Future  2. Breast pain, left No discrete mass, TTP in UOQ L breast - MM Digital Screening; Future  Patient have been counseled extensively about nutrition and exercise  Return in about 5 weeks (around 02/10/2017) for Mandesia: hypothyroidism and hyperlipidemia.  The patient was given clear instructions to go to ER or return to medical center if symptoms don't improve, worsen or new problems develop. The patient verbalized understanding. The patient was told to call to get lab results if they haven't heard anything in the next week.     Georgian Co, PA-C [facility] [place], [name]  [phone]   01/06/2017, 9:06 AM

## 2017-02-07 ENCOUNTER — Ambulatory Visit: Payer: Self-pay | Attending: Family Medicine

## 2017-02-14 ENCOUNTER — Encounter: Payer: Self-pay | Admitting: Family Medicine

## 2017-02-14 ENCOUNTER — Ambulatory Visit: Payer: Self-pay | Attending: Family Medicine | Admitting: Family Medicine

## 2017-02-14 VITALS — BP 122/76 | HR 78 | Temp 98.3°F | Resp 18 | Ht 62.0 in | Wt 167.0 lb

## 2017-02-14 DIAGNOSIS — E039 Hypothyroidism, unspecified: Secondary | ICD-10-CM | POA: Insufficient documentation

## 2017-02-14 DIAGNOSIS — H6092 Unspecified otitis externa, left ear: Secondary | ICD-10-CM | POA: Insufficient documentation

## 2017-02-14 DIAGNOSIS — E782 Mixed hyperlipidemia: Secondary | ICD-10-CM | POA: Insufficient documentation

## 2017-02-14 DIAGNOSIS — Z79899 Other long term (current) drug therapy: Secondary | ICD-10-CM | POA: Insufficient documentation

## 2017-02-14 DIAGNOSIS — H60312 Diffuse otitis externa, left ear: Secondary | ICD-10-CM

## 2017-02-14 MED ORDER — OFLOXACIN 0.3 % OT SOLN: 10.0000 [drp] | Freq: Every day | OTIC | 0 refills | Status: DC

## 2017-02-14 MED FILL — OFLOXACIN 0.3% EAR DROPS: 0.3 | 7 days supply | Qty: 5 | Fill #0

## 2017-02-14 NOTE — Patient Instructions (Addendum)
Otitis externa  (Otitis Externa)  La otitis externa es una infeccin del canal auditivo externo. El canal auditivo externo se ubica entre la parte externa del odo y la membrana del tmpano. A veces a la otitis externa se la conoce como "odo del nadador".  CUIDADOS EN EL HOGAR   Si le indicaron que se coloque gotas para los odos con antibitico, pngaselas como se lo haya indicado el mdico. No deje de usarlas aunque la afeccin mejore.   Tome los medicamentos de venta libre y los recetados solamente como se lo haya indicado el mdico.   Concurra a todas las visitas de control como se lo haya indicado el mdico. Esto es importante.  PREVENCIN   Mantenga el odo seco. Use la punta de una toalla para secarse el odo despus de nadar o darse un bao.   Intente no rascarse ni ponerse cosas en el odo. Esto facilita el ingreso de los grmenes al odo.   No nade en lagos, agua sucia o piscinas que pueden no contener la cantidad correcta de una sustancia qumica llamada cloro.   Considere la posibilidad de preparar gotas ticas y ponerse 3 o 4gotas en cada odo despus de nadar. Pregntele al mdico cmo puede preparar las gotas para los odos.  SOLICITE AYUDA SI:   Tiene fiebre.   El odo sigue estando rojo, hinchado o le duele despus de 3das.   Sigue teniendo secrecin de pus despus de 3das.   El dolor, la hinchazn o el enrojecimiento empeoran.   Siente un dolor de cabeza muy intenso.   Tiene enrojecimiento, hinchazn, dolor o sensibilidad detrs de la oreja.  Esta informacin no tiene como fin reemplazar el consejo del mdico. Asegrese de hacerle al mdico cualquier pregunta que tenga.  Document Released: 06/14/2011 Document Revised: 04/12/2014 Document Reviewed: 12/30/2014  Elsevier Interactive Patient Education  2018 Elsevier Inc.

## 2017-02-14 NOTE — Progress Notes (Signed)
Patient is here for f/up   Patient complains left ear pain that comes & goes

## 2017-02-14 NOTE — Progress Notes (Deleted)
   Subjective:  Patient ID: Crystal Fleming, female    DOB: 02/25/1976  Age: 41 y.o. MRN: [id number]  CC: No chief complaint on file.   HPI Crystal Fleming presents for ***       Outpatient Medications Prior to Visit  Medication Sig Dispense Refill  . levothyroxine (SYNTHROID, LEVOTHROID) 112 MCG tablet Take 1 tablet (112 mcg total) by mouth daily. 90 tablet 1  . pravastatin (PRAVACHOL) 40 MG tablet Take 1 tablet (40 mg total) by mouth daily. 90 tablet 3  . ibuprofen (ADVIL,MOTRIN) 600 MG tablet Take 1 tablet (600 mg total) by mouth every 8 (eight) hours as needed for moderate pain or cramping (Take with food.). 30 tablet 0   No facility-administered medications prior to visit.     ROS Review of Systems  Review of Systems - {ros [id number]}    Objective:  BP 122/76 (BP Location: Left Arm, Patient Position: Sitting, Cuff Size: Normal)   Pulse 78   Temp 98.3 F (36.8 C) (Oral)   Resp 18   Ht 5\' 2"  (1.575 m)   Wt 167 lb (75.8 kg)   SpO2 99%   BMI 30.54 kg/m   BP/Weight 02/14/2017 01/06/2017 11/10/2016  Systolic BP 122 112 104  Diastolic BP 76 78 67  Wt. (Lbs) 167 166.2 168  BMI 30.54 30.4 30.73     Physical Exam   Assessment & Plan:   Problem List Items Addressed This Visit    None      No orders of the defined types were placed in this encounter.   Follow-up: No Follow-up on file.   Lizbeth BarkMandesia R Maxcine Strong FNP

## 2017-02-14 NOTE — Progress Notes (Signed)
   Subjective:  Patient ID: Crystal Fleming, female    DOB: 12/24/1975  Age: 41 y.o. MRN: [id number]  CC: Ear Pain    HPI Crystal Fleming presents for ear pain. Interpreter services used. Onset 1 1/2 weeks ago. She reports pressure, pain, and itching of the ears left greater than right. Pain 5/10. She reports taking Advil for symptoms. PHM of hypothyroidism, HLD. She denies fatigue, weight changes, heat/cold intolerance, bowel/skin changes or CVS symptoms. Previous thyroid studies include TSH. History of mixed HLD. She reports taking medications daily.   Outpatient Medications Prior to Visit  Medication Sig Dispense Refill  . levothyroxine (SYNTHROID, LEVOTHROID) 112 MCG tablet Take 1 tablet (112 mcg total) by mouth daily. 90 tablet 1  . pravastatin (PRAVACHOL) 40 MG tablet Take 1 tablet (40 mg total) by mouth daily. 90 tablet 3  . ibuprofen (ADVIL,MOTRIN) 600 MG tablet Take 1 tablet (600 mg total) by mouth every 8 (eight) hours as needed for moderate pain or cramping (Take with food.). 30 tablet 0   No facility-administered medications prior to visit.     ROS Review of Systems  Constitutional: Negative.   HENT: Positive for ear pain. Negative for ear discharge, rhinorrhea and sneezing.   Eyes: Negative.   Respiratory: Negative.   Cardiovascular: Negative.   Gastrointestinal: Negative.   Endocrine: Negative.   Skin: Negative.     Objective:  BP 122/76 (BP Location: Left Arm, Patient Position: Sitting, Cuff Size: Normal)   Pulse 78   Temp 98.3 F (36.8 C) (Oral)   Resp 18   Ht 5\' 2"  (1.575 m)   Wt 167 lb (75.8 kg)   SpO2 99%   BMI 30.54 kg/m   BP/Weight 02/14/2017 01/06/2017 11/10/2016  Systolic BP 122 112 104  Diastolic BP 76 78 67  Wt. (Lbs) 167 166.2 168  BMI 30.54 30.4 30.73     Physical Exam  Constitutional: She appears well-developed and well-nourished.  HENT:  Head: Normocephalic and atraumatic.  Right Ear: There is swelling and tenderness.  No drainage. Tympanic membrane is not injected and not erythematous. No decreased hearing is noted.  Left Ear: There is swelling (external canal) and tenderness. No drainage. Tympanic membrane is not injected and not erythematous. No decreased hearing is noted.  Nose: Rhinorrhea present.  Eyes: Conjunctivae are normal. Pupils are equal, round, and reactive to light.  Neck: No thyromegaly present.  Cardiovascular: Normal rate, regular rhythm, normal heart sounds and intact distal pulses.  Pulmonary/Chest: Effort normal and breath sounds normal.  Abdominal: Soft. Bowel sounds are normal.  Lymphadenopathy:    She has no cervical adenopathy.  Skin: Skin is warm and dry.  Psychiatric: She has a normal mood and affect.  Nursing note and vitals reviewed.  Assessment & Plan:   1. Acute diffuse otitis externa of left ear  - ofloxacin (FLOXIN) 0.3 % OTIC solution; Place 10 drops daily into both ears. For 7 days.  Dispense: 5 mL; Refill: 0  2. Hypothyroidism, unspecified type  - TSH  3. Mixed hyperlipidemia Cont.current dosage of atorvastatin.     Follow-up: Return if symptoms worsen or fail to improve.   Lizbeth BarkMandesia R Laroy Mustard FNP

## 2017-02-15 LAB — TSH: TSH: 8.51 u[IU]/mL — ABNORMAL HIGH (ref 0.450–4.500)

## 2017-02-22 ENCOUNTER — Other Ambulatory Visit: Payer: Self-pay | Admitting: Family Medicine

## 2017-02-22 DIAGNOSIS — E039 Hypothyroidism, unspecified: Secondary | ICD-10-CM

## 2017-02-22 MED ORDER — LEVOTHYROXINE SODIUM 137 MCG PO TABS: 137.0000 ug | ORAL_TABLET | Freq: Every day | ORAL | 0 refills | Status: DC

## 2017-02-23 ENCOUNTER — Telehealth: Payer: Self-pay

## 2017-02-23 NOTE — Telephone Encounter (Signed)
-----   Message from Lizbeth BarkMandesia R Hairston, FNP sent at 02/22/2017  1:53 PM EST ----- Thyroid function is not normal on current dosage of levothyroxine. Your dosage of levothyroxine will be increased follow up in 2 months.

## 2017-02-23 NOTE — Telephone Encounter (Signed)
CMA call regarding lab results   Patient Verify DOB   Patient was aware and understood  

## 2017-02-28 ENCOUNTER — Ambulatory Visit
Admission: RE | Admit: 2017-02-28 | Discharge: 2017-02-28 | Disposition: A | Discharge status: Home / Self Care | Payer: No Typology Code available for payment source | Source: Ambulatory Visit | Attending: Physician Assistant | Admitting: Physician Assistant

## 2017-02-28 DIAGNOSIS — Z1239 Encounter for other screening for malignant neoplasm of breast: Secondary | ICD-10-CM

## 2017-02-28 DIAGNOSIS — N644 Mastodynia: Secondary | ICD-10-CM

## 2017-06-02 ENCOUNTER — Telehealth: Payer: Self-pay | Admitting: Family Medicine

## 2017-06-02 DIAGNOSIS — E039 Hypothyroidism, unspecified: Secondary | ICD-10-CM

## 2017-06-02 MED ORDER — LEVOTHYROXINE SODIUM 137 MCG PO TABS: 137.0000 ug | ORAL_TABLET | Freq: Every day | ORAL | 0 refills | Status: DC

## 2017-06-02 NOTE — Telephone Encounter (Signed)
Refilled x 30 days. 

## 2017-06-02 NOTE — Telephone Encounter (Signed)
Pt called to request a refill on  -levothyroxine (SYNTHROID, LEVOTHROID) 137 MCG tablet  Until her next office visit, where she will Re-establish. If approved please send to  [facility] [address] - [place], [place] - [phone] Please follow up

## 2017-06-08 ENCOUNTER — Ambulatory Visit: Payer: Self-pay | Attending: Internal Medicine

## 2017-07-04 ENCOUNTER — Other Ambulatory Visit: Payer: Self-pay | Admitting: Family Medicine

## 2017-07-04 DIAGNOSIS — E039 Hypothyroidism, unspecified: Secondary | ICD-10-CM

## 2017-07-04 MED ORDER — LEVOTHYROXINE SODIUM 137 MCG PO TABS: 137.0000 ug | ORAL_TABLET | Freq: Every day | ORAL | 0 refills | Status: DC

## 2017-07-04 NOTE — Telephone Encounter (Signed)
Refilled - must keep next appt for further refills.

## 2017-07-04 NOTE — Telephone Encounter (Signed)
Pt came in to request a medication refill -levothyroxine (SYNTHROID, LEVOTHROID) 137 MCG tablet  To [facility] - [place], [place] - [phone] Until her next appt

## 2017-07-04 NOTE — Addendum Note (Signed)
Addended by: Santa LighterHAMMER, Brendaliz Kuk K on: 07/04/2017 09:04 AM   Modules accepted: Orders

## 2017-07-05 ENCOUNTER — Ambulatory Visit: Payer: No Typology Code available for payment source | Admitting: Family Medicine

## 2017-08-03 ENCOUNTER — Other Ambulatory Visit: Payer: Self-pay

## 2017-08-03 ENCOUNTER — Ambulatory Visit: Payer: Self-pay | Attending: Family Medicine | Admitting: Physician Assistant

## 2017-08-03 VITALS — BP 122/80 | HR 81 | Temp 98.3°F | Resp 16 | Wt 168.6 lb

## 2017-08-03 DIAGNOSIS — Z880 Allergy status to penicillin: Secondary | ICD-10-CM | POA: Insufficient documentation

## 2017-08-03 DIAGNOSIS — R51 Headache: Secondary | ICD-10-CM

## 2017-08-03 DIAGNOSIS — M79602 Pain in left arm: Secondary | ICD-10-CM | POA: Insufficient documentation

## 2017-08-03 DIAGNOSIS — Z79899 Other long term (current) drug therapy: Secondary | ICD-10-CM | POA: Insufficient documentation

## 2017-08-03 DIAGNOSIS — E039 Hypothyroidism, unspecified: Secondary | ICD-10-CM | POA: Insufficient documentation

## 2017-08-03 DIAGNOSIS — B029 Zoster without complications: Secondary | ICD-10-CM | POA: Insufficient documentation

## 2017-08-03 DIAGNOSIS — R079 Chest pain, unspecified: Secondary | ICD-10-CM | POA: Insufficient documentation

## 2017-08-03 DIAGNOSIS — R519 Headache, unspecified: Secondary | ICD-10-CM

## 2017-08-03 DIAGNOSIS — Z789 Other specified health status: Secondary | ICD-10-CM

## 2017-08-03 MED ORDER — VALACYCLOVIR HCL 1 G PO TABS: 1000.0000 mg | ORAL_TABLET | Freq: Three times a day (TID) | ORAL | 0 refills | Status: DC

## 2017-08-03 MED ORDER — IBUPROFEN 600 MG PO TABS
600.0000 mg | ORAL_TABLET | Freq: Three times a day (TID) | ORAL | 0 refills | Status: DC | PRN
Start: 2017-08-03 — End: 2017-10-05

## 2017-08-03 MED FILL — valACYclovir HCL 1 GM TABS: 1 | 7 days supply | Qty: 21 | Fill #0

## 2017-08-03 MED FILL — IBUPROFEN 600 MG TABLET: 600 | 20 days supply | Qty: 60 | Fill #0

## 2017-08-03 NOTE — Progress Notes (Signed)
Patient ID: Crystal Fleming, female   DOB: 09-26-1975, 42 y.o.   MRN: [id number]     Crystal Fleming, is a 42 y.o. female  WUJ:[id number]  [id number]  DOB - 01-18-1976  Subjective:  Chief Complaint and HPI: Crystal Fleming is a 42 y.o. female here L arm pain.  NKI.  R hand dominant.  Started yesterday morning.  Some pain in mid to L chest.  No new or different activities.  No SOB.  No FH early heart problems.  Pain is constant and waxes and wanes in intensity after she takes advil.    +HA that started 2 days ago, frontal HA pain and pressure.  advil helps some.    Crystal Fleming interpreters translating   ROS:   Constitutional:  No f/c, No night sweats, No unexplained weight loss. EENT:  No vision changes, No blurry vision, No hearing changes. No mouth, throat, or ear problems. L side of face and neck hurts Respiratory: No cough, No SOB Cardiac: + CP, no palpitations GI:  No abd pain, No N/V/D. GU: No Urinary s/sx Musculoskeletal: L arm pain Neuro: No headache, no dizziness, no motor weakness.  Skin: No rash Endocrine:  No polydipsia. No polyuria.  Psych: Denies SI/HI  No problems updated.  ALLERGIES: Allergies  Allergen Reactions  . Penicillins     PAST MEDICAL HISTORY: Past Medical History:  Diagnosis Date  . Hypothyroid 2010    MEDICATIONS AT HOME: Prior to Admission medications   Medication Sig Start Date End Date Taking? Authorizing Provider  ibuprofen (ADVIL,MOTRIN) 600 MG tablet Take 1 tablet (600 mg total) by mouth every 8 (eight) hours as needed for moderate pain or cramping (Take with food.). 08/03/17   Anders Simmonds, PA-C  levothyroxine (SYNTHROID, LEVOTHROID) 137 MCG tablet Take 1 tablet (137 mcg total) by mouth daily. 07/04/17   Hoy Register, MD  ofloxacin (FLOXIN) 0.3 % OTIC solution Place 10 drops daily into both ears. For 7 days. Patient not taking: Reported on 08/03/2017 02/14/17   Lizbeth Bark, FNP    pravastatin (PRAVACHOL) 40 MG tablet Take 1 tablet (40 mg total) by mouth daily. 11/19/16   Lizbeth Bark, FNP  valACYclovir (VALTREX) 1000 MG tablet Take 1 tablet (1,000 mg total) by mouth 3 (three) times daily. 08/03/17   Anders Simmonds, PA-C     Objective:  EXAM:   Vitals:   08/03/17 1111  BP: 122/80  Pulse: 81  Resp: 16  Temp: 98.3 F (36.8 C)  TempSrc: Oral  SpO2: 99%  Weight: 168 lb 9.6 oz (76.5 kg)    General appearance : A&OX3. NAD. Non-toxic-appearing HEENT: Atraumatic and Normocephalic.  PERRLA. EOM intact.  TM clear B. Mouth-MMM, post pharynx WNL w/o erythema, No PND. Neck: supple, no JVD. No cervical lymphadenopathy. No thyromegaly Chest/Lungs:  Breathing-non-labored, Good air entry bilaterally, breath sounds normal without rales, rhonchi, or wheezing  CVS: S1 S2 regular, no murmurs, gallops, rubs  Extremities: Bilateral Lower Ext shows no edema, both legs are warm to touch with = pulse throughout Neurology:  CN II-XII grossly intact, Non focal.   Psych:  TP linear. J/I WNL. Normal speech. Appropriate eye contact and affect.  Skin:  No Rash,  But the skin over her L arm and L chest, L face and L neck are TTP and the skin "feels different"  Data Review Lab Results  Component Value Date   HGBA1C 5.5 06/16/2016     Assessment & Plan   1. Chest pain, unspecified type  Not likely cardiac.  EKG with NSR and no ST changes - ibuprofen (ADVIL,MOTRIN) 600 MG tablet; Take 1 tablet (600 mg total) by mouth every 8 (eight) hours as needed for moderate pain or cramping (Take with food.).  Dispense: 60 tablet; Refill: 0 - EKG 12-Lead  2. Left arm pain - ibuprofen (ADVIL,MOTRIN) 600 MG tablet; Take 1 tablet (600 mg total) by mouth every 8 (eight) hours as needed for moderate pain or cramping (Take with food.).  Dispense: 60 tablet; Refill: 0  3. Herpes zoster without complication Likely shingles prodrome that she is having - valACYclovir (VALTREX) 1000 MG tablet;  Take 1 tablet (1,000 mg total) by mouth 3 (three) times daily.  Dispense: 21 tablet; Refill: 0 - ibuprofen (ADVIL,MOTRIN) 600 MG tablet; Take 1 tablet (600 mg total) by mouth every 8 (eight) hours as needed for moderate pain or cramping (Take with food.).  Dispense: 60 tablet; Refill: 0  4. Acute nonintractable headache, unspecified headache type No red flags - ibuprofen (ADVIL,MOTRIN) 600 MG tablet; Take 1 tablet (600 mg total) by mouth every 8 (eight) hours as needed for moderate pain or cramping (Take with food.).  Dispense: 60 tablet; Refill: 0  5. Language barrier stratus interpreters used and additional time performing visit was required.   Patient have been counseled extensively about nutrition and exercise  Return for keep 5/17 appt.  The patient was given clear instructions to go to ER or return to medical center if symptoms don't improve, worsen or new problems develop. The patient verbalized understanding. The patient was told to call to get lab results if they haven't heard anything in the next week.    Georgian Co, PA-C [facility] [place], [name] [phone]   08/03/2017, 12:55 PM

## 2017-08-03 NOTE — Patient Instructions (Signed)
Culebrilla  (Shingles)  La culebrilla es una infección que causa una erupción dolorosa en la piel y ampollas llenas de líquido. La culebrilla está causada por el mismo virus que causa la varicela.  Solo se manifiesta en personas que:  · Tuvieron varicela.  · Recibieron la vacuna contra la varicela. (Esto es poco frecuente).  Los primeros síntomas de la culebrilla pueden ser picazón, hormigueo o dolor en una zona de la piel. Luego de unos días o semanas aparecerá una erupción cutánea. La erupción suele presentarse en un solo lado del cuerpo en un patrón con forma de cinto o de banda. Con el tiempo, la erupción se convierte en ampollas llenas de líquido que se abren, forman costras y se secan. Los medicamentos pueden tener los siguientes efectos:  · Ayudar a controlar el dolor.  · Lograr una recuperación más rápida.  · Prevenir problemas a largo plazo.  CUIDADOS EN EL HOGAR  Medicamentos  · Tome los medicamentos solamente como se lo haya indicado el médico.  · Aplique una crema para calmar la picazón o con anestesia en la zona afectada, como se lo haya indicado el médico.  Cuidado de la erupción y las ampollas  · Tome un baño de agua fría o aplique compresas frías en la zona de la erupción o las ampollas como se lo haya indicado el médico. Esto aliviará el dolor y la picazón.  · Mantenga la zona de la erupción cubierta con una venda (vendaje). Use ropas sueltas.  · Mantenga limpia la zona de la erupción y las ampollas con jabón suave y agua fría, o como se lo haya indicado el médico.  · Controle la erupción todos los días para detectar signos de infección. Estos signos incluyen enrojecimiento, hinchazón o dolor que perduran o empeoran.  · No pellizque las ampollas.  · No se rasque la zona de la erupción.  Instrucciones generales  · Haga reposo tal como le indicó el médico.  · Concurra a todas las visitas de control como se lo haya indicado el médico. Esto es importante.   · Hasta tanto las ampollas formen costras, la infección puede causar varicela en las personas que nunca la tuvieron o no se vacunaron contra la varicela. Para impedir que esto sucede, evite tocar a otras personas o estar cerca de otras personas, en especial:  ? Bebés.  ? Embarazadas.  ? Niños que tienen eccema.  ? Personas mayores que han recibido un trasplante.  ? Personas con enfermedades crónicas, como leucemia y sida.  SOLICITE AYUDA SI:  · El dolor no mejora con medicamentos.  · El dolor no mejora después de que la erupción desaparece.  · La erupción parece infectada. Los signos de infección son los siguiente:  ? Enrojecimiento.  ? Hinchazón.  ? Dolor que perdura o empeora.  SOLICITE AYUDA DE INMEDIATO SI:  · La erupción aparece en el rostro o la nariz.  · Tiene dolor en el rostro, en la zona de los ojos o pérdida de la sensibilidad en un lado del rostro.  · Siente dolor o un zumbido en el oído.  · Tiene pérdida del gusto.  · La afección empeora.  Esta información no tiene como fin reemplazar el consejo del médico. Asegúrese de hacerle al médico cualquier pregunta que tenga.  Document Released: 06/18/2008 Document Revised: 07/14/2015 Document Reviewed: 01/01/2014  Elsevier Interactive Patient Education © 2018 Elsevier Inc.

## 2017-08-05 ENCOUNTER — Ambulatory Visit: Payer: No Typology Code available for payment source | Admitting: Family Medicine

## 2017-08-19 ENCOUNTER — Encounter: Payer: Self-pay | Admitting: Family Medicine

## 2017-08-19 ENCOUNTER — Ambulatory Visit: Payer: Self-pay | Attending: Family Medicine | Admitting: Family Medicine

## 2017-08-19 ENCOUNTER — Other Ambulatory Visit: Payer: Self-pay

## 2017-08-19 VITALS — BP 127/84 | HR 87 | Resp 18 | Ht 62.0 in | Wt 168.2 lb

## 2017-08-19 DIAGNOSIS — Z79890 Hormone replacement therapy (postmenopausal): Secondary | ICD-10-CM | POA: Insufficient documentation

## 2017-08-19 DIAGNOSIS — Z79899 Other long term (current) drug therapy: Secondary | ICD-10-CM | POA: Insufficient documentation

## 2017-08-19 DIAGNOSIS — Z880 Allergy status to penicillin: Secondary | ICD-10-CM | POA: Insufficient documentation

## 2017-08-19 DIAGNOSIS — E039 Hypothyroidism, unspecified: Secondary | ICD-10-CM | POA: Insufficient documentation

## 2017-08-19 DIAGNOSIS — E559 Vitamin D deficiency, unspecified: Secondary | ICD-10-CM | POA: Insufficient documentation

## 2017-08-19 NOTE — Patient Instructions (Signed)
Hipotiroidismo  Hypothyroidism  El hipotiroidismo es un trastorno de la tiroides. una glndula grande ubicada en la parte anterior e inferior del cuello. La tiroides libera hormonas que controlan el funcionamiento del organismo. En los casos de hipotiroidismo, la glndula no produce la cantidad suficiente de estas hormonas.  Cules son las causas?  Las causas del hipotiroidismo pueden incluir lo siguiente:   Infecciones virales.   Embarazo.   Un ataque del sistema de defensa (sistema inmunitario) a la tiroides.   Ciertos medicamentos.   Defectos congnitos.   Radioterapias anteriores en la cabeza o el cuello.   Tratamiento previo con yodo radioactivo.   Extirpacin quirrgica previa de una parte o de toda la tiroides.   Problemas con la glndula ubicada en el centro del cerebro (hipfisis).    Cules son los signos o los sntomas?  Los signos y los sntomas de hipotiroidismo pueden ser los siguientes:   Sensacin de falta de energa (letargo).   Incapacidad para tolerar el fro.   Aumento de peso que no puede explicarse por un cambio en la dieta o en los hbitos de ejercicio fsico.   Piel seca.   Pelo grueso.   Irregularidades menstruales.   Ralentizacin de los procesos de pensamiento.   Estreimiento.   Tristeza o depresin.    Cmo se diagnostica?  [name] puede diagnosticar el hipotiroidismo con anlisis de sangre y ecografas.  Cmo se trata?  El hipotiroidismo se trata con medicamentos que reemplazan las hormonas que el cuerpo no produce. Despus de comenzar el tratamiento, pueden pasar varias semanas hasta la desaparicin de los sntomas.  Siga estas instrucciones en su casa:   Tome los medicamentos solamente como se lo haya indicado el mdico.   Si empieza a tomar medicamentos nuevos, infrmele al mdico.   Concurra a todas las visitas de control como se lo haya indicado el mdico. Esto es importante. A medida que la enfermedad mejora, es posible que haya que modificar las dosis.  Tendr que hacerse anlisis de sangre peridicamente, de modo que el mdico pueda controlar la enfermedad.  Comunquese con un mdico si:   Los sntomas no mejoran con el tratamiento.   Est tomando medicamentos sustitutivos de la tiroides y:  ? Suda en exceso.  ? Siente temblores.  ? Est ansioso.  ? Baja de peso rpidamente.  ? No puede tolerar el calor.  ? Tiene cambios emocionales.  ? Tiene diarrea.  ? Se siente dbil.  Solicite ayuda de inmediato si:   Siente dolor en el pecho.   Tiene latidos cardacos irregulares o siente dolor en el pecho.   Nota que la frecuencia cardaca est acelerada.  Esta informacin no tiene como fin reemplazar el consejo del mdico. Asegrese de hacerle al mdico cualquier pregunta que tenga.  Document Released: 03/22/2005 Document Revised: 06/28/2016 Document Reviewed: 08/07/2013  Elsevier Interactive Patient Education  2018 Elsevier Inc.

## 2017-08-19 NOTE — Progress Notes (Signed)
Subjective:  Patient ID: Crystal Fleming, female    DOB: 07-05-1975  Age: 42 y.o. MRN: [id number]  CC: Thyroid Problem   HPI Crystal Fleming is a 67 ear old female with a history of Hypothyroidism here for a follow up visit. She has been compliant with Levothyroxine and last TSH level was elevated in 02/2017. Her labs also revealed vit D deficiency with a level of 19.8. She has no acute concerns today and is fasting in anticipation of labs.  Past Medical History:  Diagnosis Date  . Hypothyroid 2010    History reviewed. No pertinent surgical history.  Allergies  Allergen Reactions  . Penicillins      Outpatient Medications Prior to Visit  Medication Sig Dispense Refill  . ibuprofen (ADVIL,MOTRIN) 600 MG tablet Take 1 tablet (600 mg total) by mouth every 8 (eight) hours as needed for moderate pain or cramping (Take with food.). 60 tablet 0  . levothyroxine (SYNTHROID, LEVOTHROID) 137 MCG tablet Take 1 tablet (137 mcg total) by mouth daily. 30 tablet 0  . ofloxacin (FLOXIN) 0.3 % OTIC solution Place 10 drops daily into both ears. For 7 days. (Patient not taking: Reported on 08/03/2017) 5 mL 0  . pravastatin (PRAVACHOL) 40 MG tablet Take 1 tablet (40 mg total) by mouth daily. 90 tablet 3  . valACYclovir (VALTREX) 1000 MG tablet Take 1 tablet (1,000 mg total) by mouth 3 (three) times daily. 21 tablet 0   No facility-administered medications prior to visit.     ROS Review of Systems  Constitutional: Negative for activity change, appetite change and fatigue.  HENT: Negative for congestion, sinus pressure and sore throat.   Eyes: Negative for visual disturbance.  Respiratory: Negative for cough, chest tightness, shortness of breath and wheezing.   Cardiovascular: Negative for chest pain and palpitations.  Gastrointestinal: Negative for abdominal distention, abdominal pain and constipation.  Endocrine: Negative for polydipsia.  Genitourinary: Negative for dysuria  and frequency.  Musculoskeletal: Negative for arthralgias and back pain.  Skin: Negative for rash.  Neurological: Negative for tremors, light-headedness and numbness.  Hematological: Does not bruise/bleed easily.  Psychiatric/Behavioral: Negative for agitation and behavioral problems.    Objective:  BP 127/84 (BP Location: Left Arm, Patient Position: Sitting, Cuff Size: Normal)   Pulse 87   Resp 18   Ht 5' 2"  (1.575 m)   Wt 168 lb 3.2 oz (76.3 kg)   SpO2 100%   BMI 30.76 kg/m   BP/Weight 08/19/2017 08/03/2017 09/26/7626  Systolic BP 315 176 160  Diastolic BP 84 80 76  Wt. (Lbs) 168.2 168.6 167  BMI 30.76 30.84 30.54      Physical Exam  Constitutional: She is oriented to person, place, and time. She appears well-developed and well-nourished.  Neck: No thyromegaly present.  Cardiovascular: Normal rate, normal heart sounds and intact distal pulses.  No murmur heard. Pulmonary/Chest: Effort normal and breath sounds normal. She has no wheezes. She has no rales. She exhibits no tenderness.  Abdominal: Soft. Bowel sounds are normal. She exhibits no distension and no mass. There is no tenderness.  Musculoskeletal: Normal range of motion.  Neurological: She is alert and oriented to person, place, and time. Coordination normal.  Skin: Skin is warm and dry.  Psychiatric: She has a normal mood and affect.   Lab Results  Component Value Date   TSH 8.510 (H) 02/14/2017    CMP Latest Ref Rng & Units 12/24/2015 10/25/2012 08/12/2009  Glucose 65 - 99 mg/dL 81 81 -  BUN  7 - 25 mg/dL 10 9 -  Creatinine 0.50 - 1.10 mg/dL 0.58 0.58 -  Sodium 135 - 146 mmol/L 136 138 -  Potassium 3.5 - 5.3 mmol/L 4.2 4.3 -  Chloride 98 - 110 mmol/L 102 100 -  CO2 20 - 31 mmol/L 27 29 -  Calcium 8.6 - 10.2 mg/dL 9.1 9.4 -  Total Protein 6.0 - 8.3 g/dL - - 7.8  Total Bilirubin 0.3 - 1.2 mg/dL - - 1.0  Alkaline Phos 39 - 117 units/L - - 48  AST 0 - 37 units/L - - 13  ALT 0 - 35 units/L - - <8 U/L     Lipid Panel     Component Value Date/Time   CHOL 194 11/10/2016 0914   TRIG 127 11/10/2016 0914   HDL 46 11/10/2016 0914   CHOLHDL 4.2 11/10/2016 0914   CHOLHDL 4.4 12/24/2015 1154   VLDL 39 (H) 12/24/2015 1154   LDLCALC 123 (H) 11/10/2016 0914     Assessment & Plan:   1. Hypothyroidism, unspecified type Last TSH was elevated Will adjust Levothyroxine dose once levels are back - T4, free - TSH - CMP14+EGFR - Lipid panel  2. Vitamin D deficiency Last vit D level was low - VITAMIN D 25 Hydroxy (Vit-D Deficiency, Fractures)   No orders of the defined types were placed in this encounter.   Follow-up: Return in about 6 months (around 02/19/2018) for follow up of Hypothyroidism.   Charlott Rakes MD

## 2017-08-19 NOTE — Progress Notes (Signed)
Subjective:  Patient ID: Crystal Fleming, female    DOB: November 18, 1975  Age: 42 y.o. MRN: [id number]  CC: Thyroid Problem   HPI Crystal Fleming is a 26 ear old female with a history of Hypothyroidism here for a follow up visit and to establish care. She has been compliant with Levothyroxine and last TSH level was elevated in 02/2017. Her labs also revealed vit D deficiency with a level of 19.8. She has no acute concerns today and is fasting in anticipation of labs.  Past Medical History:  Diagnosis Date  . Hypothyroid 2010    History reviewed. No pertinent surgical history.  Allergies  Allergen Reactions  . Penicillins       Outpatient Medications Prior to Visit  Medication Sig Dispense Refill  . ibuprofen (ADVIL,MOTRIN) 600 MG tablet Take 1 tablet (600 mg total) by mouth every 8 (eight) hours as needed for moderate pain or cramping (Take with food.). 60 tablet 0  . levothyroxine (SYNTHROID, LEVOTHROID) 137 MCG tablet Take 1 tablet (137 mcg total) by mouth daily. 30 tablet 0  . ofloxacin (FLOXIN) 0.3 % OTIC solution Place 10 drops daily into both ears. For 7 days. (Patient not taking: Reported on 08/03/2017) 5 mL 0  . pravastatin (PRAVACHOL) 40 MG tablet Take 1 tablet (40 mg total) by mouth daily. 90 tablet 3  . valACYclovir (VALTREX) 1000 MG tablet Take 1 tablet (1,000 mg total) by mouth 3 (three) times daily. 21 tablet 0   No facility-administered medications prior to visit.     ROS Review of Systems  Constitutional: Negative for activity change, appetite change and fatigue.  HENT: Negative for congestion, sinus pressure and sore throat.   Eyes: Negative for visual disturbance.  Respiratory: Negative for cough, chest tightness, shortness of breath and wheezing.   Cardiovascular: Negative for chest pain and palpitations.  Gastrointestinal: Negative for abdominal distention, abdominal pain and constipation.  Endocrine: Negative for polydipsia.    Genitourinary: Negative for dysuria and frequency.  Musculoskeletal: Negative for arthralgias and back pain.  Skin: Negative for rash.  Neurological: Negative for tremors, light-headedness and numbness.  Hematological: Does not bruise/bleed easily.  Psychiatric/Behavioral: Negative for agitation and behavioral problems.    Objective:  BP 127/84 (BP Location: Left Arm, Patient Position: Sitting, Cuff Size: Normal)   Pulse 87   Resp 18   Ht 5' 2"  (1.575 m)   Wt 168 lb 3.2 oz (76.3 kg)   SpO2 100%   BMI 30.76 kg/m   BP/Weight 08/19/2017 08/03/2017 58/85/0277  Systolic BP 412 878 676  Diastolic BP 84 80 76  Wt. (Lbs) 168.2 168.6 167  BMI 30.76 30.84 30.54      Physical Exam  Constitutional: She is oriented to person, place, and time. She appears well-developed and well-nourished.  Neck: No thyromegaly present.  Cardiovascular: Normal rate, normal heart sounds and intact distal pulses.  No murmur heard. Pulmonary/Chest: Effort normal and breath sounds normal. She has no wheezes. She has no rales. She exhibits no tenderness.  Abdominal: Soft. Bowel sounds are normal. She exhibits no distension and no mass. There is no tenderness.  Musculoskeletal: Normal range of motion.  Neurological: She is alert and oriented to person, place, and time.    CMP Latest Ref Rng & Units 12/24/2015 10/25/2012 08/12/2009  Glucose 65 - 99 mg/dL 81 81 -  BUN 7 - 25 mg/dL 10 9 -  Creatinine 0.50 - 1.10 mg/dL 0.58 0.58 -  Sodium 135 - 146 mmol/L 136 138 -  Potassium 3.5 - 5.3 mmol/L 4.2 4.3 -  Chloride 98 - 110 mmol/L 102 100 -  CO2 20 - 31 mmol/L 27 29 -  Calcium 8.6 - 10.2 mg/dL 9.1 9.4 -  Total Protein 6.0 - 8.3 g/dL - - 7.8  Total Bilirubin 0.3 - 1.2 mg/dL - - 1.0  Alkaline Phos 39 - 117 units/L - - 48  AST 0 - 37 units/L - - 13  ALT 0 - 35 units/L - - <8 U/L    Lipid Panel     Component Value Date/Time   CHOL 194 11/10/2016 0914   TRIG 127 11/10/2016 0914   HDL 46 11/10/2016 0914    CHOLHDL 4.2 11/10/2016 0914   CHOLHDL 4.4 12/24/2015 1154   VLDL 39 (H) 12/24/2015 1154   LDLCALC 123 (H) 11/10/2016 0914    Lab Results  Component Value Date   TSH 8.510 (H) 02/14/2017     Assessment & Plan:   1. Hypothyroidism, unspecified type Uncontrolled Adjust Levothyroxine dose after TSH is obtained - T4, free - TSH - CMP14+EGFR - Lipid panel  2. Vitamin D deficiency - VITAMIN D 25 Hydroxy (Vit-D Deficiency, Fractures)   No orders of the defined types were placed in this encounter.   Follow-up: Return in about 6 months (around 02/19/2018) for follow up of Hypothyroidism.   Charlott Rakes MD

## 2017-08-20 LAB — CMP14+EGFR
ALK PHOS: 58 IU/L (ref 39–117)
ALT: 16 IU/L (ref 0–32)
AST: 12 IU/L (ref 0–40)
Albumin/Globulin Ratio: 1.8 (ref 1.2–2.2)
Albumin: 4.4 g/dL (ref 3.5–5.5)
BILIRUBIN TOTAL: 1.4 mg/dL — AB (ref 0.0–1.2)
BUN / CREAT RATIO: 16 (ref 9–23)
BUN: 10 mg/dL (ref 6–24)
CHLORIDE: 104 mmol/L (ref 96–106)
CO2: 20 mmol/L (ref 20–29)
CREATININE: 0.63 mg/dL (ref 0.57–1.00)
Calcium: 9.3 mg/dL (ref 8.7–10.2)
GFR calc Af Amer: 130 mL/min/{1.73_m2} (ref >59)
GFR calc non Af Amer: 113 mL/min/{1.73_m2} (ref >59)
GLUCOSE: 89 mg/dL (ref 65–99)
Globulin, Total: 2.5 g/dL (ref 1.5–4.5)
Potassium: 4.1 mmol/L (ref 3.5–5.2)
Sodium: 139 mmol/L (ref 134–144)
Total Protein: 6.9 g/dL (ref 6.0–8.5)

## 2017-08-20 LAB — VITAMIN D 25 HYDROXY (VIT D DEFICIENCY, FRACTURES): Vit D, 25-Hydroxy: 22.5 ng/mL — ABNORMAL LOW (ref 30.0–100.0)

## 2017-08-20 LAB — LIPID PANEL
CHOLESTEROL TOTAL: 165 mg/dL (ref 100–199)
Chol/HDL Ratio: 4 ratio (ref 0.0–4.4)
HDL: 41 mg/dL (ref >39)
LDL CALC: 103 mg/dL — AB (ref 0–99)
TRIGLYCERIDES: 106 mg/dL (ref 0–149)
VLDL Cholesterol Cal: 21 mg/dL (ref 5–40)

## 2017-08-20 LAB — TSH: TSH: 3.49 u[IU]/mL (ref 0.450–4.500)

## 2017-08-20 LAB — T4, FREE: FREE T4: 1.5 ng/dL (ref 0.82–1.77)

## 2017-08-22 ENCOUNTER — Other Ambulatory Visit: Payer: Self-pay | Admitting: Family Medicine

## 2017-08-22 DIAGNOSIS — E039 Hypothyroidism, unspecified: Secondary | ICD-10-CM

## 2017-08-22 MED ORDER — LEVOTHYROXINE SODIUM 137 MCG PO TABS: 137.0000 ug | ORAL_TABLET | Freq: Every day | ORAL | 3 refills | Status: DC

## 2017-08-22 MED ORDER — ERGOCALCIFEROL 1.25 MG (50000 UT) PO CAPS: 50000.0000 [IU] | ORAL_CAPSULE | ORAL | 1 refills | Status: DC

## 2017-08-23 ENCOUNTER — Telehealth: Payer: Self-pay

## 2017-08-23 NOTE — Telephone Encounter (Signed)
-----   Message from Hoy Register, MD sent at 08/22/2017 11:59 AM EDT ----- Labs reveal vitamin D deficiency for which I have sent a refill to the pharmacy.  Thyroid, cholesterol and other labs are stable.

## 2017-08-23 NOTE — Telephone Encounter (Signed)
Patient was called and informed of lab results via interpretor([id number]). 

## 2017-09-05 ENCOUNTER — Ambulatory Visit: Payer: No Typology Code available for payment source | Attending: Family Medicine

## 2017-10-05 ENCOUNTER — Ambulatory Visit: Payer: Self-pay | Attending: Family Medicine | Admitting: Physician Assistant

## 2017-10-05 VITALS — BP 117/71 | HR 75 | Temp 98.6°F | Resp 18 | Ht 62.0 in | Wt 168.0 lb

## 2017-10-05 DIAGNOSIS — N940 Mittelschmerz: Secondary | ICD-10-CM

## 2017-10-05 DIAGNOSIS — Z791 Long term (current) use of non-steroidal anti-inflammatories (NSAID): Secondary | ICD-10-CM | POA: Insufficient documentation

## 2017-10-05 DIAGNOSIS — N898 Other specified noninflammatory disorders of vagina: Secondary | ICD-10-CM | POA: Insufficient documentation

## 2017-10-05 DIAGNOSIS — R102 Pelvic and perineal pain unspecified side: Secondary | ICD-10-CM

## 2017-10-05 DIAGNOSIS — Z79890 Hormone replacement therapy (postmenopausal): Secondary | ICD-10-CM | POA: Insufficient documentation

## 2017-10-05 DIAGNOSIS — E039 Hypothyroidism, unspecified: Secondary | ICD-10-CM | POA: Insufficient documentation

## 2017-10-05 DIAGNOSIS — Z880 Allergy status to penicillin: Secondary | ICD-10-CM | POA: Insufficient documentation

## 2017-10-05 DIAGNOSIS — Z79899 Other long term (current) drug therapy: Secondary | ICD-10-CM | POA: Insufficient documentation

## 2017-10-05 LAB — POCT URINALYSIS DIPSTICK
BILIRUBIN UA: NEGATIVE
Glucose, UA: NEGATIVE
KETONES UA: NEGATIVE
Leukocytes, UA: NEGATIVE
Nitrite, UA: NEGATIVE
PH UA: 5.5 (ref 5.0–8.0)
PROTEIN UA: NEGATIVE
RBC UA: NEGATIVE
Spec Grav, UA: 1.02 (ref 1.010–1.025)
UROBILINOGEN UA: 0.2 U/dL

## 2017-10-05 LAB — POCT URINE PREGNANCY: Preg Test, Ur: NEGATIVE

## 2017-10-05 MED ORDER — IBUPROFEN 800 MG PO TABS: 800.0000 mg | ORAL_TABLET | Freq: Three times a day (TID) | ORAL | 0 refills | Status: DC | PRN

## 2017-10-05 MED FILL — IBUPROFEN 800 MG TABLET: 800 | 20 days supply | Qty: 60 | Fill #0

## 2017-10-05 NOTE — Progress Notes (Signed)
Patient ID: Crystal Fleming, female   DOB: 1975/10/07, 42 y.o.   MRN: [id number]        Crystal Fleming, is a 42 y.o. female  XBJ:[id number]  HYQ:[id number]  DOB - 1975-07-22  Subjective:  Chief Complaint and HPI: Crystal Fleming is a 42 y.o. female here today Pain in RLQ for more than a year and it has worsened over the last week.  LMP 09/22/2017. No N/V.  Some vaginal discharge.  No concern for STI.  Pelvic U/S in 2018 for same problem was negative.  No f/c.  No change in appetite.  No diarrhea.  No constipation "Crystal Fleming" with [employer].  Ibuprofen 600mg  helps only a little.     ROS:   Constitutional:  No f/c, No night sweats, No unexplained weight loss. EENT:  No vision changes, No blurry vision, No hearing changes. No mouth, throat, or ear problems.  Respiratory: No cough, No SOB Cardiac: No CP, no palpitations GI:  +RLQ pain, No N/V/D. GU: No Urinary s/sx Musculoskeletal: No joint pain Neuro: No headache, no dizziness, no motor weakness.  Skin: No rash Endocrine:  No polydipsia. No polyuria.  Psych: Denies SI/HI  No problems updated.  ALLERGIES: Allergies  Allergen Reactions  . Penicillins     PAST MEDICAL HISTORY: Past Medical History:  Diagnosis Date  . Hypothyroid 2010    MEDICATIONS AT HOME: Prior to Admission medications   Medication Sig Start Date End Date Taking? Authorizing Provider  ergocalciferol (DRISDOL) 50000 units capsule Take 1 capsule (50,000 Units total) by mouth once a week. 08/22/17  Yes Hoy Register, MD  levothyroxine (SYNTHROID, LEVOTHROID) 137 MCG tablet Take 1 tablet (137 mcg total) by mouth daily. 08/22/17  Yes Hoy Register, MD  pravastatin (PRAVACHOL) 40 MG tablet Take 1 tablet (40 mg total) by mouth daily. 11/19/16  Yes Hairston, Oren Beckmann, FNP  ibuprofen (ADVIL,MOTRIN) 800 MG tablet Take 1 tablet (800 mg total) by mouth every 8 (eight) hours as needed. 10/05/17   Anders Simmonds, PA-C  ofloxacin  (FLOXIN) 0.3 % OTIC solution Place 10 drops daily into both ears. For 7 days. Patient not taking: Reported on 08/03/2017 02/14/17   Lizbeth Bark, FNP  valACYclovir (VALTREX) 1000 MG tablet Take 1 tablet (1,000 mg total) by mouth 3 (three) times daily. Patient not taking: Reported on 10/05/2017 08/03/17   Anders Simmonds, PA-C     Objective:  EXAM:   Vitals:   10/05/17 1123  BP: 117/71  Pulse: 75  Resp: 18  Temp: 98.6 F (37 C)  TempSrc: Oral  SpO2: 99%  Weight: 168 lb (76.2 kg)  Height: 5\' 2"  (1.575 m)    General appearance : A&OX3. NAD. Non-toxic-appearing HEENT: Atraumatic and Normocephalic.  PERRLA. EOM intact.  Neck: supple, no JVD. No cervical lymphadenopathy. No thyromegaly Chest/Lungs:  Breathing-non-labored, Good air entry bilaterally, breath sounds normal without rales, rhonchi, or wheezing  CVS: S1 S2 regular, no murmurs, gallops, rubs  Abdomen: Bowel sounds present, Non tender and not distended with no gaurding, rigidity or rebound. Neg McBurneys.   Extremities: Bilateral Lower Ext shows no edema, both legs are warm to touch with = pulse throughout Neurology:  CN II-XII grossly intact, Non focal.   Psych:  TP linear. J/I WNL. Normal speech. Appropriate eye contact and affect.  Skin:  No Rash  Data Review Lab Results  Component Value Date   HGBA1C 5.5 06/16/2016     Assessment & Plan   1. Pelvic pain Non-acute abdomen - Urinalysis  Dipstick - POCT urine pregnancy - Urine cytology ancillary only - CBC with Differential/Platelet Increase dose- ibuprofen (ADVIL,MOTRIN) 800 MG tablet; Take 1 tablet (800 mg total) by mouth every 8 (eight) hours as needed.  Dispense: 60 tablet; Refill: 0 -to ED if fever/vomiting develops or pain worsens  2. Mittelschmerz Given timing, I believe this is causing her pain.    Patient have been counseled extensively about nutrition and exercise  Return for keep 11/16/2017 appt with Dr Alvis LemmingsNewlin.  The patient was given clear  instructions to go to ER or return to medical center if symptoms don't improve, worsen or new problems develop. The patient verbalized understanding. The patient was told to call to get lab results if they haven't heard anything in the next week.     Georgian CoAngela Weslynn Ke, PA-C [facility] [place], [place] [phone]   10/05/2017, 11:32 AM

## 2017-10-05 NOTE — Patient Instructions (Signed)
Dolor intermenstrual (Mittelschmerz) El dolor intermenstrual se percibe en la parte baja del abdomen entre una menstruacin y la siguiente.  El dolor afecta un lado del abdomen. El lado que se ve afectado puede cambiar de un mes a Therapist, artotro.  El dolor puede ser leve o intenso  y durar minutos u horas. No dura ms de 1 o 2das.  El dolor puede presentarse con nuseas y un sangrado vaginal leve. El dolor intermenstrual es frecuente en las mujeres. Se debe a la maduracin y a Research scientist (medical)la liberacin de un vulo desde el ovario, y es una parte natural del ciclo ovulatorio. Suele ocurrir Citigroupunas dos semanas despus de la finalizacin de la [place]menstruacin. INSTRUCCIONES PARA EL CUIDADO EN EL HOGAR Est atenta a cualquier cambio en la afeccin. Tome estas medidas para aliviar el dolor:  Intente tomar un bao de inmersin con agua caliente.  Tome los medicamentos de venta libre y los recetados solamente como se lo haya indicado el mdico.  OceanographerConcurra a todas las visitas de control como se lo haya indicado el mdico. Esto es importante. SOLICITE ATENCIN MDICA SI:  El dolor es muy intenso casi todos los Emerald Mountainmeses.  El dolor abdominal dura ms de 24horas.  El medicamento no IT trainerle calma el dolor.  Tiene fiebre.  Tiene nuseas o vmitos que no desaparecen.  Falta del perodo menstrual.  Entre una menstruacin y la siguiente, el sangrado vaginal es ms abundante que un [place]manchado. Esta informacin no tiene Theme park managercomo fin reemplazar el consejo del mdico. Asegrese de hacerle al mdico cualquier pregunta que tenga. Document Released: 12/30/2004 Document Revised: 12/11/2014 Document Reviewed: 06/17/2014 Elsevier Interactive Patient Education  [employer]2018 Elsevier Inc.

## 2017-10-06 LAB — URINE CYTOLOGY ANCILLARY ONLY
Chlamydia: NEGATIVE
Neisseria Gonorrhea: NEGATIVE
Trichomonas: NEGATIVE

## 2017-10-06 LAB — CBC WITH DIFFERENTIAL/PLATELET
Basophils Absolute: 0 10*3/uL (ref 0.0–0.2)
Basos: 0 %
EOS (ABSOLUTE): 0.1 10*3/uL (ref 0.0–0.4)
EOS: 1 %
HEMOGLOBIN: 13.7 g/dL (ref 11.1–15.9)
Hematocrit: 40.5 % (ref 34.0–46.6)
IMMATURE GRANS (ABS): 0 10*3/uL (ref 0.0–0.1)
Immature Granulocytes: 0 %
LYMPHS ABS: 2 10*3/uL (ref 0.7–3.1)
LYMPHS: 38 %
MCH: 31.2 pg (ref 26.6–33.0)
MCHC: 33.8 g/dL (ref 31.5–35.7)
MCV: 92 fL (ref 79–97)
MONOCYTES: 6 %
Monocytes Absolute: 0.3 10*3/uL (ref 0.1–0.9)
Neutrophils Absolute: 2.9 10*3/uL (ref 1.4–7.0)
Neutrophils: 55 %
Platelets: 266 10*3/uL (ref 150–450)
RBC: 4.39 x10E6/uL (ref 3.77–5.28)
RDW: 13.5 % (ref 12.3–15.4)
WBC: 5.3 10*3/uL (ref 3.4–10.8)

## 2017-10-10 LAB — URINE CYTOLOGY ANCILLARY ONLY
BACTERIAL VAGINITIS: NEGATIVE
Candida vaginitis: NEGATIVE

## 2017-10-13 ENCOUNTER — Telehealth: Payer: Self-pay | Admitting: *Deleted

## 2017-10-13 NOTE — Telephone Encounter (Signed)
-----   Message from Anders SimmondsAngela M McClung, [place] JerseyPA-C sent at 10/13/2017  8:21 AM EDT ----- Please call patient.  Blood work and urine testing for vaginal infection was normal.  Follow-up as planned. Thanks, Georgian CoAngela McClung, PA-C

## 2017-10-13 NOTE — Telephone Encounter (Signed)
Medical Assistant left message on patient's home and cell voicemail. Voicemail states to give a call back to [place] [facility] at [phone]. !!!Please inform patient of urine and blood work being normal. Patient will follow up as planned!!!

## 2017-10-17 ENCOUNTER — Telehealth: Payer: Self-pay | Admitting: Family Medicine

## 2017-10-17 NOTE — Telephone Encounter (Signed)
Patient called and verified DOB then was given her Lab results left by her nurse

## 2017-11-16 ENCOUNTER — Ambulatory Visit: Payer: No Typology Code available for payment source | Admitting: Family Medicine

## 2017-12-12 ENCOUNTER — Ambulatory Visit (INDEPENDENT_AMBULATORY_CARE_PROVIDER_SITE_OTHER): Payer: Self-pay | Admitting: Obstetrics & Gynecology

## 2017-12-12 ENCOUNTER — Encounter: Payer: Self-pay | Admitting: Obstetrics & Gynecology

## 2017-12-12 VITALS — BP 144/77 | HR 78 | Wt 167.5 lb

## 2017-12-12 DIAGNOSIS — E039 Hypothyroidism, unspecified: Secondary | ICD-10-CM

## 2017-12-12 DIAGNOSIS — N938 Other specified abnormal uterine and vaginal bleeding: Secondary | ICD-10-CM

## 2017-12-12 DIAGNOSIS — Z0000 Encounter for general adult medical examination without abnormal findings: Secondary | ICD-10-CM

## 2017-12-12 DIAGNOSIS — Z23 Encounter for immunization: Secondary | ICD-10-CM

## 2017-12-12 NOTE — Progress Notes (Signed)
   Subjective:    Patient ID: Crystal Fleming, female    DOB: 06/17/1975, 42 y.o.   MRN: [id number]  HPI 42 yo married P3 (26, 28, 45) here today with a 3-4 month h/o DUB q2 weeks. She has been taking OCPs for 2 months. She had a normal u/s 3/18. She had used them in the past for about 5 years.    Review of Systems Pap 3/18 normal Mammogram normal 10/18    Objective:   Physical Exam Breathing, conversing, and ambulating normally Well nourished, well hydrated Latina, no apparent distress  Live interpretor used for the exam Cervix with dark red blood, normal otherwise Bimanual exam reveals a ULN size uterus      Assessment & Plan:  DUB- check cbc, tsh, gyn u/s

## 2017-12-12 NOTE — Addendum Note (Signed)
Addended by: Osvaldo Human on: 12/12/2017 11:40 AM   Modules accepted: Orders

## 2017-12-13 LAB — CBC
HEMATOCRIT: 41.1 % (ref 34.0–46.6)
HEMOGLOBIN: 14.2 g/dL (ref 11.1–15.9)
MCH: 30.7 pg (ref 26.6–33.0)
MCHC: 34.5 g/dL (ref 31.5–35.7)
MCV: 89 fL (ref 79–97)
Platelets: 336 10*3/uL (ref 150–450)
RBC: 4.63 x10E6/uL (ref 3.77–5.28)
RDW: 12.3 % (ref 12.3–15.4)
WBC: 6.1 10*3/uL (ref 3.4–10.8)

## 2017-12-13 LAB — TSH: TSH: 1.09 u[IU]/mL (ref 0.450–4.500)

## 2017-12-19 ENCOUNTER — Ambulatory Visit (HOSPITAL_COMMUNITY)
Admission: RE | Admit: 2017-12-19 | Discharge: 2017-12-19 | Disposition: A | Discharge status: Home / Self Care | Payer: No Typology Code available for payment source | Source: Ambulatory Visit | Attending: Obstetrics & Gynecology | Admitting: Obstetrics & Gynecology

## 2017-12-19 DIAGNOSIS — N938 Other specified abnormal uterine and vaginal bleeding: Secondary | ICD-10-CM | POA: Insufficient documentation

## 2017-12-26 ENCOUNTER — Encounter: Payer: Self-pay | Admitting: *Deleted

## 2018-01-04 ENCOUNTER — Ambulatory Visit (INDEPENDENT_AMBULATORY_CARE_PROVIDER_SITE_OTHER): Payer: Self-pay | Admitting: Obstetrics & Gynecology

## 2018-01-04 ENCOUNTER — Other Ambulatory Visit (HOSPITAL_COMMUNITY)
Admission: RE | Admit: 2018-01-04 | Discharge: 2018-01-04 | Disposition: A | Discharge status: Home / Self Care | Payer: No Typology Code available for payment source | Source: Ambulatory Visit | Attending: Obstetrics & Gynecology | Admitting: Obstetrics & Gynecology

## 2018-01-04 ENCOUNTER — Encounter: Payer: Self-pay | Admitting: Obstetrics & Gynecology

## 2018-01-04 VITALS — BP 126/75 | HR 76 | Ht 60.0 in | Wt 169.0 lb

## 2018-01-04 DIAGNOSIS — N938 Other specified abnormal uterine and vaginal bleeding: Secondary | ICD-10-CM

## 2018-01-04 DIAGNOSIS — Z3202 Encounter for pregnancy test, result negative: Secondary | ICD-10-CM

## 2018-01-04 DIAGNOSIS — Z758 Other problems related to medical facilities and other health care: Secondary | ICD-10-CM

## 2018-01-04 DIAGNOSIS — Z789 Other specified health status: Secondary | ICD-10-CM

## 2018-01-04 DIAGNOSIS — Z01812 Encounter for preprocedural laboratory examination: Secondary | ICD-10-CM

## 2018-01-04 LAB — POCT PREGNANCY, URINE: Preg Test, Ur: NEGATIVE

## 2018-01-04 NOTE — Progress Notes (Signed)
   Subjective:    Patient ID: Crystal Fleming, female    DOB: [birth year], 42 y.o.   MRN: [id number]  HPI  42 yo P3 with DUB. Her u/s showed a mildly thickened endometrium. Her TSH is normal. Her Hbg is 14.2. She uses OCPs for contraception.  Review of Systems Pap normal 3/18    Objective:   Physical Exam Breathing, conversing, and ambulating normally Well nourished, well hydrated Latina, no apparent distress  Live interpretor present for exam  UPT negative, consent signed, time out done Cervix prepped with betadine and grasped with a single tooth tenaculum Uterus sounded to 9 cm Pipelle used for 3passes with a large amount of tissue obtained. She tolerated the procedure well.     Assessment & Plan:  DUB- EMBX Come back for treatment plan in 2 weeks

## 2018-01-05 ENCOUNTER — Telehealth: Payer: Self-pay | Admitting: Family Medicine

## 2018-01-05 DIAGNOSIS — E039 Hypothyroidism, unspecified: Secondary | ICD-10-CM

## 2018-01-05 NOTE — Telephone Encounter (Signed)
1) Medication(s) Requested (by name): levothyroxine (SYNTHROID, LEVOTHROID) 137 MCG tablet [[id number]]   2) Pharmacy of Choice:  [facility] - [place], [name] - [address]) Special Requests: Patient would just like enough for over the weekend, she has appointment on Monday.     Approved medications will be sent to the pharmacy, we will reach out if there is an issue.  Requests made after 3pm may not be addressed until the following business day!  If a patient is unsure of the name of the medication(s) please note and ask patient to call back when they are able to provide all info, do not send to responsible party until all information is available!

## 2018-01-06 MED ORDER — LEVOTHYROXINE SODIUM 137 MCG PO TABS: 137.0000 ug | ORAL_TABLET | Freq: Every day | ORAL | 2 refills | Status: DC

## 2018-01-09 ENCOUNTER — Ambulatory Visit: Payer: No Typology Code available for payment source | Attending: Family Medicine | Admitting: Family Medicine

## 2018-01-09 ENCOUNTER — Encounter: Payer: Self-pay | Admitting: Family Medicine

## 2018-01-09 VITALS — BP 134/82 | HR 70 | Temp 98.1°F | Ht 60.0 in | Wt 171.4 lb

## 2018-01-09 DIAGNOSIS — Z880 Allergy status to penicillin: Secondary | ICD-10-CM | POA: Insufficient documentation

## 2018-01-09 DIAGNOSIS — Z79890 Hormone replacement therapy (postmenopausal): Secondary | ICD-10-CM | POA: Insufficient documentation

## 2018-01-09 DIAGNOSIS — E039 Hypothyroidism, unspecified: Secondary | ICD-10-CM

## 2018-01-09 DIAGNOSIS — N939 Abnormal uterine and vaginal bleeding, unspecified: Secondary | ICD-10-CM

## 2018-01-09 DIAGNOSIS — Z791 Long term (current) use of non-steroidal anti-inflammatories (NSAID): Secondary | ICD-10-CM | POA: Insufficient documentation

## 2018-01-09 DIAGNOSIS — Z79899 Other long term (current) drug therapy: Secondary | ICD-10-CM | POA: Insufficient documentation

## 2018-01-09 MED ORDER — NORETHINDRONE-ETH ESTRADIOL 0.4-35 MG-MCG PO TABS: 1.0000 | ORAL_TABLET | Freq: Every day | ORAL | 1 refills | Status: DC

## 2018-01-09 MED ORDER — LEVOTHYROXINE SODIUM 137 MCG PO TABS: 137.0000 ug | ORAL_TABLET | Freq: Every day | ORAL | 6 refills | Status: DC

## 2018-01-09 NOTE — Progress Notes (Signed)
Subjective:  Patient ID: Crystal Fleming, female    DOB: 1975-08-04  Age: 42 y.o. MRN: [id number]  CC: Hypothyroidism   HPI Crystal Fleming is a 42 year old female with a history of Hypothyroidism here for a follow up visit.  She is requesting a refill of levothyroxine and her last TSH from last month was normal. She was seen by GYN 5 days ago for management of abnormal uterine bleeding is scheduled for endometrial biopsy in 2 weeks.  She is currently on oral contraceptive pills which she is requesting a refill of. She denies abdominal pain,nausea or vomiting, lightheadedness or syncope.  Past Medical History:  Diagnosis Date  . Hypothyroid 2010    History reviewed. No pertinent surgical history.  Allergies  Allergen Reactions  . Penicillins      Outpatient Medications Prior to Visit  Medication Sig Dispense Refill  . levothyroxine (SYNTHROID, LEVOTHROID) 137 MCG tablet Take 1 tablet (137 mcg total) by mouth daily. 30 tablet 2  . norethindrone-ethinyl estradiol (OVCON-35,BALZIVA,BRIELLYN) 0.4-35 MG-MCG tablet Take 1 tablet by mouth daily.    Marland Kitchen ibuprofen (ADVIL,MOTRIN) 600 MG tablet Take 600 mg by mouth every 8 (eight) hours as needed for moderate pain.     No facility-administered medications prior to visit.     ROS Review of Systems  Constitutional: Negative for activity change, appetite change and fatigue.  HENT: Negative for congestion, sinus pressure and sore throat.   Eyes: Negative for visual disturbance.  Respiratory: Negative for cough, chest tightness, shortness of breath and wheezing.   Cardiovascular: Negative for chest pain and palpitations.  Gastrointestinal: Negative for abdominal distention, abdominal pain and constipation.  Endocrine: Negative for polydipsia.  Genitourinary: Negative for dysuria and frequency.  Musculoskeletal: Negative for arthralgias and back pain.  Skin: Negative for rash.  Neurological: Negative for tremors,  light-headedness and numbness.  Hematological: Does not bruise/bleed easily.  Psychiatric/Behavioral: Negative for agitation and behavioral problems.    Objective:  BP 134/82   Pulse 70   Temp 98.1 F (36.7 C) (Oral)   Ht 5' (1.524 m)   Wt 171 lb 6.4 oz (77.7 kg)   LMP 12/11/2017 Comment: pt reports periods can be as close as every 2 weeks  SpO2 100%   BMI 33.47 kg/m   BP/Weight 01/09/2018 01/04/2018 12/12/2017  Systolic BP 134 126 144  Diastolic BP 82 75 77  Wt. (Lbs) 171.4 169 167.5  BMI 33.47 33.01 30.64      Physical Exam  Constitutional: She is oriented to person, place, and time. She appears well-developed and well-nourished.  Cardiovascular: Normal rate, normal heart sounds and intact distal pulses.  No murmur heard. Pulmonary/Chest: Effort normal and breath sounds normal. She has no wheezes. She has no rales. She exhibits no tenderness.  Abdominal: Soft. Bowel sounds are normal. She exhibits no distension and no mass. There is no tenderness.  Musculoskeletal: Normal range of motion.  Neurological: She is alert and oriented to person, place, and time.     CMP Latest Ref Rng & Units 08/19/2017 12/24/2015 10/25/2012  Glucose 65 - 99 mg/dL 89 81 81  BUN 6 - 24 mg/dL 10 10 9   Creatinine 0.57 - 1.00 mg/dL 9.62 9.52 8.41  Sodium 134 - 144 mmol/L 139 136 138  Potassium 3.5 - 5.2 mmol/L 4.1 4.2 4.3  Chloride 96 - 106 mmol/L 104 102 100  CO2 20 - 29 mmol/L 20 27 29   Calcium 8.7 - 10.2 mg/dL 9.3 9.1 9.4  Total Protein 6.0 -  8.5 g/dL 6.9 - -  Total Bilirubin 0.0 - 1.2 mg/dL 1.6(X) - -  Alkaline Phos 39 - 117 IU/L 58 - -  AST 0 - 40 IU/L 12 - -  ALT 0 - 32 IU/L 16 - -    Lipid Panel     Component Value Date/Time   CHOL 165 08/19/2017 0855   TRIG 106 08/19/2017 0855   HDL 41 08/19/2017 0855   CHOLHDL 4.0 08/19/2017 0855   CHOLHDL 4.4 12/24/2015 1154   VLDL 39 (H) 12/24/2015 1154   LDLCALC 103 (H) 08/19/2017 0855    Lab Results  Component Value Date   TSH 1.090  12/12/2017    Assessment & Plan:   1. Hypothyroidism, unspecified type Controlled - levothyroxine (SYNTHROID, LEVOTHROID) 137 MCG tablet; Take 1 tablet (137 mcg total) by mouth daily.  Dispense: 30 tablet; Refill: 6  2. Abnormal uterine bleeding Scheduled for endometrial biopsy with GYN. Continue OCP   Meds ordered this encounter  Medications  . levothyroxine (SYNTHROID, LEVOTHROID) 137 MCG tablet    Sig: Take 1 tablet (137 mcg total) by mouth daily.    Dispense:  30 tablet    Refill:  6  . norethindrone-ethinyl estradiol (OVCON-35,BALZIVA,BRIELLYN) 0.4-35 MG-MCG tablet    Sig: Take 1 tablet by mouth daily.    Dispense:  1 Package    Refill:  1    Follow-up: Return in about 6 months (around 07/11/2018) for Follow-up of chronic medical conditions.   Hoy Register MD

## 2018-01-09 NOTE — Patient Instructions (Signed)
Hipotiroidismo  Hypothyroidism  El hipotiroidismo es un trastorno de la tiroides. una glndula grande ubicada en la parte anterior e inferior del cuello. La tiroides libera hormonas que controlan el funcionamiento del organismo. En los casos de hipotiroidismo, la glndula no produce la cantidad suficiente de estas hormonas.  Cules son las causas?  Las causas del hipotiroidismo pueden incluir lo siguiente:   Infecciones virales.   Embarazo.   Un ataque del sistema de defensa (sistema inmunitario) a la tiroides.   Ciertos medicamentos.   Defectos congnitos.   Radioterapias anteriores en la cabeza o el cuello.   Tratamiento previo con yodo radioactivo.   Extirpacin quirrgica previa de una parte o de toda la tiroides.   Problemas con la glndula ubicada en el centro del cerebro (hipfisis).    Cules son los signos o los sntomas?  Los signos y los sntomas de hipotiroidismo pueden ser los siguientes:   Sensacin de falta de energa (letargo).   Incapacidad para tolerar el fro.   Aumento de peso que no puede explicarse por un cambio en la dieta o en los hbitos de ejercicio fsico.   Piel seca.   Pelo grueso.   Irregularidades menstruales.   Ralentizacin de los procesos de pensamiento.   Estreimiento.   Tristeza o depresin.    Cmo se diagnostica?  [name] puede diagnosticar el hipotiroidismo con anlisis de sangre y ecografas.  Cmo se trata?  El hipotiroidismo se trata con medicamentos que reemplazan las hormonas que el cuerpo no produce. Despus de comenzar el tratamiento, pueden pasar varias semanas hasta la desaparicin de los sntomas.  Siga estas instrucciones en su casa:   Tome los medicamentos solamente como se lo haya indicado el mdico.   Si empieza a tomar medicamentos nuevos, infrmele al mdico.   Concurra a todas las visitas de control como se lo haya indicado el mdico. Esto es importante. A medida que la enfermedad mejora, es posible que haya que modificar las dosis.  Tendr que hacerse anlisis de sangre peridicamente, de modo que el mdico pueda controlar la enfermedad.  Comunquese con un mdico si:   Los sntomas no mejoran con el tratamiento.   Est tomando medicamentos sustitutivos de la tiroides y:  ? Suda en exceso.  ? Siente temblores.  ? Est ansioso.  ? Baja de peso rpidamente.  ? No puede tolerar el calor.  ? Tiene cambios emocionales.  ? Tiene diarrea.  ? Se siente dbil.  Solicite ayuda de inmediato si:   Siente dolor en el pecho.   Tiene latidos cardacos irregulares o siente dolor en el pecho.   Nota que la frecuencia cardaca est acelerada.  Esta informacin no tiene como fin reemplazar el consejo del mdico. Asegrese de hacerle al mdico cualquier pregunta que tenga.  Document Released: 03/22/2005 Document Revised: 06/28/2016 Document Reviewed: 08/07/2013  Elsevier Interactive Patient Education  2018 Elsevier Inc.

## 2018-01-20 ENCOUNTER — Other Ambulatory Visit (HOSPITAL_COMMUNITY): Payer: Self-pay | Admitting: *Deleted

## 2018-01-20 ENCOUNTER — Encounter: Payer: Self-pay | Admitting: Obstetrics & Gynecology

## 2018-01-20 ENCOUNTER — Ambulatory Visit (INDEPENDENT_AMBULATORY_CARE_PROVIDER_SITE_OTHER): Payer: No Typology Code available for payment source | Admitting: Obstetrics & Gynecology

## 2018-01-20 VITALS — BP 133/59 | HR 72 | Ht 60.0 in | Wt 188.9 lb

## 2018-01-20 DIAGNOSIS — Z1231 Encounter for screening mammogram for malignant neoplasm of breast: Secondary | ICD-10-CM

## 2018-01-20 DIAGNOSIS — N938 Other specified abnormal uterine and vaginal bleeding: Secondary | ICD-10-CM

## 2018-01-20 NOTE — Progress Notes (Signed)
   Subjective:    Patient ID: Crystal Fleming, female    DOB: [birth year], 42 y.o.   MRN: [id number]  HPI 42 yo married P3 here for follow up after a EMBX done for DUB. Her u/s showed a 13 mm endometrium and the embx was negative. Her hbg is 14. She takes OCPs for contraception. She reports that her bleeding has "gotten better".   Review of Systems Pap smear normal 3/18.    Objective:   Physical Exam Breathing, conversing, and ambulating normally Well nourished, well hydrated Latina, no apparent distress  Lungs- CTAB Heart- rrr without murmur, rub, gallop      Assessment & Plan:  Borderline HTN- I have encouraged weight loss She will see her primary care doctor about her BP DUB- controlled with OCPs but she reports "pressure in my chest". I have encouraged her to go to urgent care/ER today. Stop OCPs Keep a bleeding diary Use condoms prn Come back prn

## 2018-01-26 ENCOUNTER — Ambulatory Visit: Payer: Self-pay | Attending: Family Medicine | Admitting: Physician Assistant

## 2018-01-26 VITALS — BP 131/85 | HR 70 | Temp 98.6°F | Resp 18 | Ht 60.0 in | Wt 169.0 lb

## 2018-01-26 DIAGNOSIS — R6889 Other general symptoms and signs: Secondary | ICD-10-CM

## 2018-01-26 NOTE — Progress Notes (Signed)
Patient ID: Crystal Fleming, female   DOB: 12-20-1975, 42 y.o.   MRN: [id number]    Crystal Fleming, is a 42 y.o. female  XLK:[id number]  [id number]:[id number]  DOB - 21-Jul-1975  Subjective:  Chief Complaint and HPI: Crystal Fleming is a 42 y.o. female here today BP issues.  Went to gyn 01/20/2018 for abnormal BP.  Patient feels fine.  No dizziness/HA/change in vision.  No CP.  BP in Epic at that visit was 133/59, pulse 72.  Grace Blight with [employer] interpreters translating.    ROS:   Constitutional:  No f/c, No night sweats, No unexplained weight loss. EENT:  No vision changes, No blurry vision, No hearing changes. No mouth, throat, or ear problems.  Respiratory: No cough, No SOB Cardiac: No CP, no palpitations GI:  No abd pain, No N/V/D. GU: No Urinary s/sx Musculoskeletal: No joint pain Neuro: No headache, no dizziness, no motor weakness.  Skin: No rash Endocrine:  No polydipsia. No polyuria.  Psych: Denies SI/HI  No problems updated.  ALLERGIES: Allergies  Allergen Reactions  . Penicillins     PAST MEDICAL HISTORY: Past Medical History:  Diagnosis Date  . Hypothyroid 2010    MEDICATIONS AT HOME: Prior to Admission medications   Medication Sig Start Date End Date Taking? Authorizing Provider  ibuprofen (ADVIL,MOTRIN) 600 MG tablet Take 600 mg by mouth every 8 (eight) hours as needed for moderate pain.    [provider]  levothyroxine (SYNTHROID, LEVOTHROID) 137 MCG tablet Take 1 tablet (137 mcg total) by mouth daily. 01/09/18   Hoy Register, MD  norethindrone-ethinyl estradiol (OVCON-35,BALZIVA,BRIELLYN) 0.4-35 MG-MCG tablet Take 1 tablet by mouth daily. 01/09/18   Hoy Register, MD     Objective:  EXAM:   Vitals:   01/26/18 0901  BP: 131/85  Pulse: 70  Resp: 18  Temp: 98.6 F (37 C)  TempSrc: Oral  SpO2: 99%  Weight: 169 lb (76.7 kg)  Height: 5' (1.524 m)    General appearance : A&OX3. NAD. Non-toxic-appearing HEENT:  Atraumatic and Normocephalic.  PERRLA. EOM intact.  Neck: supple, no JVD. No cervical lymphadenopathy. No thyromegaly. No bruit Chest/Lungs:  Breathing-non-labored, Good air entry bilaterally, breath sounds normal without rales, rhonchi, or wheezing  CVS: S1 S2 regular, no murmurs, gallops, rubs  Extremities: Bilateral Lower Ext shows no edema, both legs are warm to touch with = pulse throughout Neurology:  CN II-XII grossly intact, Non focal.   Psych:  TP linear. J/I WNL. Normal speech. Appropriate eye contact and affect.  Skin:  No Rash  Data Review Lab Results  Component Value Date   HGBA1C 5.5 06/16/2016     Assessment & Plan   1. Change in blood pressure BP is WNL today.  In rviewing Epic, BP overall normal.  Last labs normal.  Check BP and pulse 3-5 times/week and record and bring to next visit.  Doubt there will be any need for meds.  Drink plenty of water.     Patient have been counseled extensively about nutrition and exercise  Return in about 3 weeks (around 02/16/2018) for BP check with Franky Macho.  The patient was given clear instructions to go to ER or return to medical center if symptoms don't improve, worsen or new problems develop. The patient verbalized understanding. The patient was told to call to get lab results if they haven't heard anything in the next week.     Georgian Co, PA-C [facility] and [facility] [place], [name] [phone]   01/26/2018, 9:03  AM

## 2018-01-26 NOTE — Patient Instructions (Addendum)
Drink 80-100 ounces water daily  Check blood pressure and pulse 3 times per week and record and bring to next visit.

## 2018-02-21 ENCOUNTER — Ambulatory Visit: Payer: Self-pay | Attending: Family Medicine | Admitting: Family Medicine

## 2018-02-21 ENCOUNTER — Encounter: Payer: Self-pay | Admitting: Family Medicine

## 2018-02-21 VITALS — BP 139/87 | HR 74 | Temp 97.7°F | Ht 60.0 in | Wt 170.6 lb

## 2018-02-21 DIAGNOSIS — R030 Elevated blood-pressure reading, without diagnosis of hypertension: Secondary | ICD-10-CM | POA: Insufficient documentation

## 2018-02-21 DIAGNOSIS — Z880 Allergy status to penicillin: Secondary | ICD-10-CM | POA: Insufficient documentation

## 2018-02-21 DIAGNOSIS — Z79890 Hormone replacement therapy (postmenopausal): Secondary | ICD-10-CM | POA: Insufficient documentation

## 2018-02-21 DIAGNOSIS — E039 Hypothyroidism, unspecified: Secondary | ICD-10-CM | POA: Insufficient documentation

## 2018-02-21 MED ORDER — LEVOTHYROXINE SODIUM 137 MCG PO TABS: 137.0000 ug | ORAL_TABLET | Freq: Every day | ORAL | 6 refills | Status: DC

## 2018-02-21 NOTE — Patient Instructions (Signed)
Prevencin de la hipertensin Preventing Hypertension La hipertensin, conocida comnmente como presin arterial alta, se produce cuando la sangre bombea en las arterias con mucha fuerza. Las arterias son vasos sanguneos que transportan la sangre desde el corazn al resto del cuerpo. Con el transcurso del [place], la hipertensin puede daar las arterias y Engineer, manufacturing systemsdisminuir el flujo de sangre hacia partes importantes del cuerpo que incluyen el cerebro, el corazn y los riones. Con frecuencia, la hipertensin no causa sntomas hasta que la presin arterial es muy alta. Por este motivo, es importante que controle regularmente su presin arterial. La hipertensin se puede prevenir con frecuencia con cambios en la dieta y el estilo de vida. Si ya tiene hipertensin, puede controlarla con cambios en la dieta y el estilo de vida y con medicamentos. Qu cambios en la alimentacin se pueden hacer? Mantenga una dieta saludable. Esto incluye lo siguiente:  Menor ingesta de sal (sodio). Pregntele al mdico cunto sodio puede consumir de forma segura. La recomendacin general es consumir menos de 1cucharadita (2300mg ) de sodio por da. ? No agregue sal a las comidas. ? Opte por alimentos con bajo contenido de sodio cuando realice las compras o coma fuera de casa.  Limite la cantidad de grasa en la dieta. Esto se puede lograr con Enterprise Productslcteos descremados o de bajo contenido de grasas e ingiriendo menor cantidad de carnes rojas.  Coma ms frutas, verduras y cereales integrales. Establezca un objetivo para comer: ? 1 a 2tazas de frutas y verduras frescas todos los [address]  [address]. ? 3 a 4porciones de cereales Thrivent Financialintegrales todos los das.  Evite los alimentos y las bebidas que tengan azcares agregados.  Coma pescados que contengan grasas saludables (cidos grasos omega-3), como la caballa o el salmn.  Si necesita implementar un plan de comidas saludable, pruebe la dieta DASH. Esta dieta tiene un alto contenido de frutas,  verduras y Radiation protection practitionercereales integrales. Incluye poca cantidad de sodio, carnes rojas y azcares agregados. DASH es la sigla en ingls de "Enfoques Alimentarios para Detener la Hipertensin". Qu cambios en el estilo de vida se pueden realizar?  Baje de peso si es necesario. Con tan solo bajar entre el 3% y el 5% del peso corporal puede prevenir o controlar la hipertensin. ? Por ejemplo, si su peso actual es de 200libras (91kg), una prdida entre el 3% y el 5% de su peso significa perder entre 6 y 10libras (2,7 a 4,5kg). ? Pdale al mdico que le recomiende una dieta y un plan de ejercicios para bajar de peso de forma segura.  Ejerctese lo suficiente. Debe realizar al menos 150minutos de ejercicios de intensidad moderada todas las semanas. ? Theatre stage manageruede realizar este tiempo en sesiones cortas de ejercicios, varias veces al da, o puede realizar sesiones ms largas, pero menos veces por semana. Por ejemplo, puede realizar una caminata enrgica o andar en bicicleta durante 10minutos, 3veces al da, durante 5das a la semana.  Encuentre maneras de reducir el estrs, como hacer ejercicios, Primary school teachermeditar, Optometristescuchar msica o tomar una clase de yoga. Si necesita ayuda para reducir J. C. Penneyel nivel de estrs, consulte al mdico.  No fume. Esto incluye los cigarrillos electrnicos. Las sustancias qumicas presentes en los productos con tabaco y nicotina elevan su presin arterial cada vez que fuma. Si necesita ayuda para dejar de fumar, consulte al mdico.  Evite el alcohol. Si bebe alcohol, limite el consumo a no ms de 1medida por da si es mujer y no est Hav[place] de Graceembarazada, y 2medidas por da si es hombre. Crystal GatesUna medida equivale a 12onzas  de cerveza, 5onzas de vino o 1onzas de bebidas alcohlicas de alta graduacin. Por qu son importantes estos cambios? Los [employer] dieta y el estilo de vida pueden ayudar a prevenir la hipertensin y a Passenger transport manager al mejorar su calidad de vida. Si tiene hipertensin, Copywriter, advertising ayudarn a Theatre manager y a Financial trader ms importantes, como, por ejemplo:  El endurecimiento y Catering manager de las arterias que proveen sangre a: ? Su corazn. Esto puede producirle un infarto de miocardio. ? Su cerebro. Esto puede ser la causa de un accidente cerebrovascular. ? Los riones. Esto puede causar insuficiencia renal.  Estrs en el msculo cardaco, lo que puede producir insuficiencia cardaca.  Qu puedo hacer para reducir mis riesgos?  Trabaje junto al mdico para desarrollar un plan de prevencin de la hipertensin que funcione para usted. Siga su plan y concurra a todas las visitas de control como se lo haya indicado el mdico.  Aprenda a medir su presin arterial en casa. Asegrese de Solicitor su objetivo de presin arterial, como se lo haya indicado el mdico. Cmo se trata? Adems de los cambios en la dieta y el estilo de vida, [place] mdico podr indicarle medicamentos para ayudarle a Publishing copy su presin arterial. Tal vez deba probar distintos medicamentos hasta encontrar el ms adecuado para usted. Quiz necesite tomar ms de uno. Tome los medicamentos de venta libre y los recetados solamente como se lo haya indicado el mdico. Dnde encontrar apoyo: Su mdico puede ayudarle a prevenir la hipertensin y Pharmacologist su presin arterial en un nivel saludable. Su hospital o comunidad locales tambin pueden proporcionarle servicios y programas de prevencin. La Asociacin Americana del Corazn (American Heart Association) ofrece un red de soporte en lnea en: https://www.lee.net/ Dnde encontrar ms informacin: Obtenga ms informacin sobre la hipertensin en:  Training and development officer del Programmer, multimedia, del Pulmn y de Risk manager (National Heart, Lung, and Blood Institute): https://www.peterson.org/  Centros para el control y Engineer, agricultural prevencin de Child psychotherapist for Disease Control and Prevention, CDC):  AboutHD.co.nz  Market researcher de Mdicos de Cabin crew (American Academy of Family Physicians): http://familydoctor.org/familydoctor/en/diseases-conditions/high-blood-pressure.printerview.all.html  Obtenga ms informacin sobre la dieta DASH en:  Training and development officer del [place], del Pulmn y de Risk manager (National Heart, Lung, and Blood Institute): WedMap.it  Comunquese con un mdico si:  Piensa que tiene Runner, broadcasting/film/video a los medicamentos que ha tomado.  Tiene mareos o dolores de cabeza con Naval architect.  Tiene hinchazn en los tobillos.  Tiene problemas de visin. Resumen  La hipertensin con frecuencia no provoca sntomas hasta que la presin arterial es muy alta. Es importante que controle regularmente su presin arterial.  Los cambios en la dieta y el estilo de vida son los pasos ms importantes hacia la prevencin de la hipertensin.  Si mantiene su presin arterial en un nivel saludable, podr prevenir complicaciones como infarto de miocardio, insuficiencia cardaca, accidente cerebrovascular e insuficiencia renal.  Trabaje junto al mdico para desarrollar un plan de prevencin de la hipertensin que funcione para usted. Esta informacin no tiene Theme park manager el consejo del mdico. Asegrese de hacerle al mdico cualquier pregunta que tenga. Document Released: 04/06/2015 Document Revised: 06/30/2016 Document Reviewed: 04/06/2015 Elsevier Interactive Patient Education  [employer].

## 2018-02-21 NOTE — Progress Notes (Signed)
Subjective:  Patient ID: Crystal Fleming, female    DOB: 08/20/1975  Age: 42 y.o. MRN: [id number]  CC: Hypertension   HPI Crystal Fleming is a 42 year old female with history of hypothyroidism who presents today for evaluation of her blood pressure which was thought to be elevated at her GYN visit however at her last visit with the PA it was 131/85 and today it is 139/87. She has never been on any antihypertensive and denies headaches, blurry vision or chest pains. At her visit with GYN she had complained of headache and was told it was due to her blood pressure but she feels fine now. She has no additional concerns today.  Past Medical History:  Diagnosis Date  . Hypothyroid 2010    No past surgical history on file.  Allergies  Allergen Reactions  . Penicillins      Outpatient Medications Prior to Visit  Medication Sig Dispense Refill  . ibuprofen (ADVIL,MOTRIN) 600 MG tablet Take 600 mg by mouth every 8 (eight) hours as needed for moderate pain.    Marland Kitchen. norethindrone-ethinyl estradiol (OVCON-35,BALZIVA,BRIELLYN) 0.4-35 MG-MCG tablet Take 1 tablet by mouth daily. 1 Package 1  . levothyroxine (SYNTHROID, LEVOTHROID) 137 MCG tablet Take 1 tablet (137 mcg total) by mouth daily. 30 tablet 6   No facility-administered medications prior to visit.     ROS Review of Systems  Constitutional: Negative for activity change, appetite change and fatigue.  HENT: Negative for congestion, sinus pressure and sore throat.   Eyes: Negative for visual disturbance.  Respiratory: Negative for cough, chest tightness, shortness of breath and wheezing.   Cardiovascular: Negative for chest pain and palpitations.  Gastrointestinal: Negative for abdominal distention, abdominal pain and constipation.  Endocrine: Negative for polydipsia.  Genitourinary: Negative for dysuria and frequency.  Musculoskeletal: Negative for arthralgias and back pain.  Skin: Negative for rash.    Neurological: Negative for tremors, light-headedness and numbness.  Hematological: Does not bruise/bleed easily.  Psychiatric/Behavioral: Negative for agitation and behavioral problems.    Objective:  BP 139/87   Pulse 74   Temp 97.7 F (36.5 C) (Oral)   Ht 5' (1.524 m)   Wt 170 lb 9.6 oz (77.4 kg)   SpO2 100%   BMI 33.32 kg/m   BP/Weight 02/21/2018 01/26/2018 01/20/2018  Systolic BP 139 131 133  Diastolic BP 87 85 59  Wt. (Lbs) 170.6 169 188.9  BMI 33.32 33.01 36.89      Physical Exam  Constitutional: She is oriented to person, place, and time. She appears well-developed and well-nourished.  Cardiovascular: Normal rate, normal heart sounds and intact distal pulses.  No murmur heard. Pulmonary/Chest: Effort normal and breath sounds normal. She has no wheezes. She has no rales. She exhibits no tenderness.  Abdominal: Soft. Bowel sounds are normal. She exhibits no distension and no mass. There is no tenderness.  Musculoskeletal: Normal range of motion.  Neurological: She is alert and oriented to person, place, and time.  Skin: Skin is warm and dry.  Psychiatric: She has a normal mood and affect.     Assessment & Plan:   1. Elevated blood pressure reading Advised on lifestyle modifications, no antihypertensive indicated at this time Would reassess at next visit Counseled on blood pressure goal of less than 130/80, low-sodium, DASH diet, medication compliance, 150 minutes of moderate intensity exercise per week. Discussed medication compliance, adverse effects.   2. Hypothyroidism, unspecified type Controlled - levothyroxine (SYNTHROID, LEVOTHROID) 137 MCG tablet; Take 1 tablet (137 mcg total) by mouth  daily.  Dispense: 30 tablet; Refill: 6   Meds ordered this encounter  Medications  . levothyroxine (SYNTHROID, LEVOTHROID) 137 MCG tablet    Sig: Take 1 tablet (137 mcg total) by mouth daily.    Dispense:  30 tablet    Refill:  6    Follow-up: Return in about 6  months (around 08/22/2018) for follow up of Hypothyroidism.   Hoy Register MD

## 2018-03-06 ENCOUNTER — Ambulatory Visit: Payer: Self-pay | Attending: Family Medicine

## 2018-04-11 ENCOUNTER — Ambulatory Visit (HOSPITAL_COMMUNITY)
Admission: RE | Admit: 2018-04-11 | Discharge: 2018-04-11 | Disposition: A | Discharge status: Home / Self Care | Payer: Self-pay | Source: Ambulatory Visit | Attending: Obstetrics and Gynecology | Admitting: Obstetrics and Gynecology

## 2018-04-11 ENCOUNTER — Encounter (HOSPITAL_COMMUNITY): Payer: Self-pay

## 2018-04-11 ENCOUNTER — Ambulatory Visit
Admission: RE | Admit: 2018-04-11 | Discharge: 2018-04-11 | Disposition: A | Discharge status: Home / Self Care | Payer: No Typology Code available for payment source | Source: Ambulatory Visit | Attending: Obstetrics and Gynecology | Admitting: Obstetrics and Gynecology

## 2018-04-11 VITALS — BP 142/100 | Wt 174.0 lb

## 2018-04-11 DIAGNOSIS — Z1239 Encounter for other screening for malignant neoplasm of breast: Secondary | ICD-10-CM

## 2018-04-11 DIAGNOSIS — Z1231 Encounter for screening mammogram for malignant neoplasm of breast: Secondary | ICD-10-CM

## 2018-04-11 NOTE — Patient Instructions (Signed)
Explained breast self awareness with Tiffani Fleming. Patient did not need a Pap smear today due to last Pap smear and HPV typing was 06/16/2016. Let her know BCCCP will cover Pap smears and HPV typing every 5 years unless has a history of abnormal Pap smears. [facility]. Appointment scheduled for Tuesday, April 11, 2018 at 1640. Patient aware of appointment and will be there. Let patient know the Breast Center will follow up with her within the next couple weeks with results of mammogram by letter or phone. Crystal Fleming verbalized understanding.  Jahzir Strohmeier, Kathaleen Maserhristine Poll, RN 3:18 PM

## 2018-04-11 NOTE — Progress Notes (Signed)
No complaints today.   Pap Smear: Pap smear not completed today. [facility] and normal with negative HPV. Per patient has no history of an abnormal Pap smear. Last Pap smear result is in Epic.  Physical exam: Breasts Breasts symmetrical. No skin abnormalities bilateral breasts. No nipple retraction bilateral breasts. No nipple discharge bilateral breasts. No lymphadenopathy. No lumps palpated bilateral breasts. No complaints of pain or tenderness on exam. [facility] mamm[facility]. Appointment scheduled for Tuesday, April 11, 2018 at 1640.        Pelvic/Bimanual No Pap smear completed today since last Pap smear and HPV typing was 06/16/2016. Pap smear not indicated per BCCCP guidelines.   Smoking History: Patient has never smoked.  Patient Navigation: Patient education provided. [facility]. Spanish interpreter provided.   Breast and Cervical Cancer Risk Assessment: Patient has no family history of breast cancer, known genetic mutations, or radiation treatment to the chest before age 42. Patient has no history of cervical dysplasia, immunocompromised, or DES exposure in-utero.  Risk Assessment    Risk Scores      04/11/2018   Last edited by: Lynnell DikeHolland, Sabrina H, LPN   5-year risk: 0.3 %   Lifetime risk: 4.7 %         Used Spanish interpreter Natale LayErika McReynolds from [facility].

## 2018-04-17 ENCOUNTER — Encounter (HOSPITAL_COMMUNITY): Payer: Self-pay | Admitting: *Deleted

## 2018-08-31 ENCOUNTER — Other Ambulatory Visit: Payer: Self-pay

## 2018-08-31 ENCOUNTER — Ambulatory Visit: Payer: Self-pay | Attending: Family Medicine | Admitting: Family Medicine

## 2018-08-31 ENCOUNTER — Encounter: Payer: Self-pay | Admitting: Family Medicine

## 2018-08-31 DIAGNOSIS — E039 Hypothyroidism, unspecified: Secondary | ICD-10-CM

## 2018-08-31 NOTE — Patient Instructions (Signed)
Hypothyroidism  Hypothyroidism is when the thyroid gland does not make enough of certain hormones (it is underactive). The thyroid gland is a small gland located in the lower front part of the neck, just in front of the windpipe (trachea). This gland makes hormones that help control how the body uses food for energy (metabolism) as well as how the heart and brain function. These hormones also play a role in keeping your bones strong. When the thyroid is underactive, it produces too little of the hormones thyroxine (T4) and triiodothyronine (T3). What are the causes? This condition may be caused by:  Hashimoto's disease. This is a disease in which the body's disease-fighting system (immune system) attacks the thyroid gland. This is the most common cause.  Viral infections.  Pregnancy.  Certain medicines.  Birth defects.  Past radiation treatments to the head or neck for cancer.  Past treatment with radioactive iodine.  Past exposure to radiation in the environment.  Past surgical removal of part or all of the thyroid.  Problems with a gland in the center of the brain (pituitary gland).  Lack of enough iodine in the diet. What increases the risk? You are more likely to develop this condition if:  You are female.  You have a family history of thyroid conditions.  You use a medicine called lithium.  You take medicines that affect the immune system (immunosuppressants). What are the signs or symptoms? Symptoms of this condition include:  Feeling as though you have no energy (lethargy).  Not being able to tolerate cold.  Weight gain that is not explained by a change in diet or exercise habits.  Lack of appetite.  Dry skin.  Coarse hair.  Menstrual irregularity.  Slowing of thought processes.  Constipation.  Sadness or depression. How is this diagnosed? This condition may be diagnosed based on:  Your symptoms, your medical history, and a physical exam.  Blood  tests. You may also have imaging tests, such as an ultrasound or MRI. How is this treated? This condition is treated with medicine that replaces the thyroid hormones that your body does not make. After you begin treatment, it may take several weeks for symptoms to go away. Follow these instructions at home:  Take over-the-counter and prescription medicines only as told by your health care provider.  If you start taking any new medicines, tell your health care provider.  Keep all follow-up visits as told by your health care provider. This is important. ? As your condition improves, your dosage of thyroid hormone medicine may change. ? You will need to have blood tests regularly so that your health care provider can monitor your condition. Contact a health care provider if:  Your symptoms do not get better with treatment.  You are taking thyroid replacement medicine and you: ? Sweat a lot. ? Have tremors. ? Feel anxious. ? Lose weight rapidly. ? Cannot tolerate heat. ? Have emotional swings. ? Have diarrhea. ? Feel weak. Get help right away if you have:  Chest pain.  An irregular heartbeat.  A rapid heartbeat.  Difficulty breathing. Summary  Hypothyroidism is when the thyroid gland does not make enough of certain hormones (it is underactive).  When the thyroid is underactive, it produces too little of the hormones thyroxine (T4) and triiodothyronine (T3).  The most common cause is Hashimoto's disease, a disease in which the body's disease-fighting system (immune system) attacks the thyroid gland. The condition can also be caused by viral infections, medicine, pregnancy, or past   radiation treatment to the head or neck.  Symptoms may include weight gain, dry skin, constipation, feeling as though you do not have energy, and not being able to tolerate cold.  This condition is treated with medicine to replace the thyroid hormones that your body does not make. This information  is not intended to replace advice given to you by your health care provider. Make sure you discuss any questions you have with your health care provider. Document Released: 03/22/2005 Document Revised: 03/02/2017 Document Reviewed: 03/02/2017 Elsevier Interactive Patient Education  2019 Elsevier Inc.  

## 2018-08-31 NOTE — Progress Notes (Signed)
Patient has been called and DOB has been verified. Patient has been screened and transferred to PCP to start phone visit.     

## 2018-08-31 NOTE — Progress Notes (Signed)
Virtual Visit via Telephone Note  I connected with Crystal Fleming, on 08/31/2018 at 10:39 AM by telephone due to the COVID-19 pandemic and verified that I am speaking with the correct person using two identifiers.   Consent: I discussed the limitations, risks, security and privacy concerns of performing an evaluation and management service by telephone and the availability of in person appointments. I also discussed with the patient that there may be a patient responsible charge related to this service. The patient expressed understanding and agreed to proceed.   Location of Patient: Patient's home  Location of Provider: Clinic   Persons participating in Telemedicine visit: Venera Privott Justice Rocher ID #[id number] Doloris Hall - CMA Dr Margarita Rana - PCP     History of Present Illness: Crystal Fleming is a 43 year old female with history of hypothyroidism seen for follow-up visit.  She has been compliant with levothyroxine and is needing refills. She has not checked her blood pressure lately but states headache she previously had have resolved. She denies chest pain, dyspnea, pedal edema. At the moment she does not work and is at home most of the time.   Past Medical History:  Diagnosis Date  . Hypothyroid 2010   Allergies  Allergen Reactions  . Penicillins     Current Outpatient Medications on File Prior to Visit  Medication Sig Dispense Refill  . ibuprofen (ADVIL,MOTRIN) 600 MG tablet Take 600 mg by mouth every 8 (eight) hours as needed for moderate pain.    Marland Kitchen levothyroxine (SYNTHROID, LEVOTHROID) 137 MCG tablet Take 1 tablet (137 mcg total) by mouth daily. 30 tablet 6  . norethindrone-ethinyl estradiol (OVCON-35,BALZIVA,BRIELLYN) 0.4-35 MG-MCG tablet Take 1 tablet by mouth daily. (Patient not taking: Reported on 04/11/2018) 1 Package 1   No current facility-administered medications on file prior to visit.     Observations/Objective: A;ert, awake,  oriented x3 Not in acute distress  Lab Results  Component Value Date   TSH 1.090 12/12/2017    Assessment and Plan: 1. Hypothyroidism, unspecified type Thyroid panel and I will adjust the dose of levothyroxine accordingly. - CMP14+EGFR; Future - T4, free; Future - TSH; Future - Lipid panel; Future   Follow Up Instructions: Return in about 6 months (around 03/03/2019) for Hypothyroidism.    I discussed the assessment and treatment plan with the patient. The patient was provided an opportunity to ask questions and all were answered. The patient agreed with the plan and demonstrated an understanding of the instructions.   The patient was advised to call back or seek an in-person evaluation if the symptoms worsen or if the condition fails to improve as anticipated.     I provided 10 minutes total of non-face-to-face time during this encounter including median intraservice time, reviewing previous notes, labs, imaging, medications, management and patient verbalized understanding.     Charlott Rakes, MD, FAAFP. [facility] [place], [place]   08/31/2018, 10:39 AM

## 2018-09-01 ENCOUNTER — Ambulatory Visit: Payer: Self-pay | Attending: Family Medicine

## 2018-09-01 DIAGNOSIS — E039 Hypothyroidism, unspecified: Secondary | ICD-10-CM

## 2018-09-02 LAB — LIPID PANEL
Chol/HDL Ratio: 4.6 ratio — ABNORMAL HIGH (ref 0.0–4.4)
Cholesterol, Total: 190 mg/dL (ref 100–199)
HDL: 41 mg/dL (ref >39)
LDL Calculated: 122 mg/dL — ABNORMAL HIGH (ref 0–99)
Triglycerides: 133 mg/dL (ref 0–149)
VLDL Cholesterol Cal: 27 mg/dL (ref 5–40)

## 2018-09-02 LAB — CMP14+EGFR
ALT: 14 IU/L (ref 0–32)
AST: 16 IU/L (ref 0–40)
Albumin/Globulin Ratio: 1.8 (ref 1.2–2.2)
Albumin: 4.4 g/dL (ref 3.8–4.8)
Alkaline Phosphatase: 67 IU/L (ref 39–117)
BUN/Creatinine Ratio: 18 (ref 9–23)
BUN: 14 mg/dL (ref 6–24)
Bilirubin Total: 0.9 mg/dL (ref 0.0–1.2)
CO2: 22 mmol/L (ref 20–29)
Calcium: 9.4 mg/dL (ref 8.7–10.2)
Chloride: 103 mmol/L (ref 96–106)
Creatinine, Ser: 0.76 mg/dL (ref 0.57–1.00)
GFR calc Af Amer: 113 mL/min/{1.73_m2} (ref >59)
GFR calc non Af Amer: 98 mL/min/{1.73_m2} (ref >59)
Globulin, Total: 2.4 g/dL (ref 1.5–4.5)
Glucose: 87 mg/dL (ref 65–99)
Potassium: 4.8 mmol/L (ref 3.5–5.2)
Sodium: 140 mmol/L (ref 134–144)
Total Protein: 6.8 g/dL (ref 6.0–8.5)

## 2018-09-02 LAB — TSH: TSH: 0.936 u[IU]/mL (ref 0.450–4.500)

## 2018-09-02 LAB — T4, FREE: Free T4: 1.41 ng/dL (ref 0.82–1.77)

## 2018-09-04 ENCOUNTER — Other Ambulatory Visit: Payer: Self-pay | Admitting: Family Medicine

## 2018-09-04 DIAGNOSIS — E039 Hypothyroidism, unspecified: Secondary | ICD-10-CM

## 2018-09-04 MED ORDER — LEVOTHYROXINE SODIUM 137 MCG PO TABS: 137.0000 ug | ORAL_TABLET | Freq: Every day | ORAL | 6 refills | Status: DC

## 2018-10-02 ENCOUNTER — Ambulatory Visit: Payer: Self-pay | Attending: Family Medicine

## 2018-10-02 ENCOUNTER — Other Ambulatory Visit: Payer: Self-pay

## 2018-10-10 ENCOUNTER — Other Ambulatory Visit: Payer: Self-pay

## 2018-10-10 ENCOUNTER — Ambulatory Visit: Payer: Self-pay | Attending: Family Medicine | Admitting: Family Medicine

## 2018-10-10 ENCOUNTER — Encounter: Payer: Self-pay | Admitting: Family Medicine

## 2018-10-10 DIAGNOSIS — M722 Plantar fascial fibromatosis: Secondary | ICD-10-CM

## 2018-10-10 MED ORDER — NAPROXEN 500 MG PO TABS: 500.0000 mg | ORAL_TABLET | Freq: Two times a day (BID) | ORAL | 1 refills | Status: DC

## 2018-10-10 NOTE — Progress Notes (Signed)
Patient has been called and DOB has been verified. Patient has been screened and transferred to PCP to start phone visit.  Patient is also having pain in right heel.

## 2018-10-10 NOTE — Progress Notes (Signed)
Virtual Visit via Telephone Note  I connected with Crystal Fleming, on 10/10/2018 at 1:38 PM by telephone due to the COVID-19 pandemic and verified that I am speaking with the correct person using two identifiers.   Consent: I discussed the limitations, risks, security and privacy concerns of performing an evaluation and management service by telephone and the availability of in person appointments. I also discussed with the patient that there may be a patient responsible charge related to this service. The patient expressed understanding and agreed to proceed.   Location of Patient: Home  Location of Provider: Clinic   Persons participating in Telemedicine visit: Annslee Arzate Ocie Bob Farrington-CMA Dr. Annice Needy ID # [id number]     History of Present Illness: Crystal Fleming is a 43 year old female with history of hypothyroidism seen for an acute visit. C/o foot pain x1 week in R foot. She has had similar symptom in the past only not as intense. Pain is intense in the morning then improves as the day progresses only for a to return about 3 to 4 hours later.  Pain is described as throbbing to burning and she denies paresthesia in her extremity or swelling.  Denies history of trauma.  Past Medical History:  Diagnosis Date  . Hypothyroid 2010   Allergies  Allergen Reactions  . Penicillins     Current Outpatient Medications on File Prior to Visit  Medication Sig Dispense Refill  . ibuprofen (ADVIL,MOTRIN) 600 MG tablet Take 600 mg by mouth every 8 (eight) hours as needed for moderate pain.    Marland Kitchen levothyroxine (SYNTHROID) 137 MCG tablet Take 1 tablet (137 mcg total) by mouth daily. 30 tablet 6  . norethindrone-ethinyl estradiol (OVCON-35,BALZIVA,BRIELLYN) 0.4-35 MG-MCG tablet Take 1 tablet by mouth daily. (Patient not taking: Reported on 04/11/2018) 1 Package 1   No current facility-administered medications on file prior to visit.      Observations/Objective: Awake, alert, oriented x3 Not in acute distress  Assessment and Plan: 1. Plantar fasciitis Counseled on stretching exercises Advised to obtain insoles We will place on NSAID and if symptoms do not improve will refer for cortisone injection - naproxen (NAPROSYN) 500 MG tablet; Take 1 tablet (500 mg total) by mouth 2 (two) times daily with a meal.  Dispense: 30 tablet; Refill: 1   Follow Up Instructions: Keep previously scheduled appointment   I discussed the assessment and treatment plan with the patient. The patient was provided an opportunity to ask questions and all were answered. The patient agreed with the plan and demonstrated an understanding of the instructions.   The patient was advised to call back or seek an in-person evaluation if the symptoms worsen or if the condition fails to improve as anticipated.     I provided 10 minutes total of non-face-to-face time during this encounter including median intraservice time, reviewing previous notes, labs, imaging, medications, management and patient verbalized understanding.     Charlott Rakes, MD, FAAFP. [facility] and [place], [place]   10/10/2018, 1:38 PM

## 2019-01-29 ENCOUNTER — Ambulatory Visit: Payer: No Typology Code available for payment source | Admitting: Nurse Practitioner

## 2019-05-14 ENCOUNTER — Other Ambulatory Visit: Payer: Self-pay | Admitting: Family Medicine

## 2019-05-14 DIAGNOSIS — Z1231 Encounter for screening mammogram for malignant neoplasm of breast: Secondary | ICD-10-CM

## 2019-05-17 ENCOUNTER — Other Ambulatory Visit: Payer: Self-pay

## 2019-05-17 ENCOUNTER — Ambulatory Visit: Payer: Self-pay | Attending: Family Medicine

## 2019-05-22 ENCOUNTER — Ambulatory Visit: Payer: Self-pay | Attending: Family Medicine | Admitting: Family Medicine

## 2019-05-22 ENCOUNTER — Other Ambulatory Visit: Payer: Self-pay

## 2019-05-22 ENCOUNTER — Encounter: Payer: Self-pay | Admitting: Family Medicine

## 2019-05-22 VITALS — BP 135/83 | HR 74 | Ht 60.0 in | Wt 174.0 lb

## 2019-05-22 DIAGNOSIS — E039 Hypothyroidism, unspecified: Secondary | ICD-10-CM

## 2019-05-22 NOTE — Patient Instructions (Signed)
Hacer ejercicio para bajar de peso Exercising to [employer] El ejercicio es la actividad fsica estructurada y repetitiva que se realiza para mejorar el [place] fsico y Technical sales engineer. Hacer ejercicio de forma regular es importante para todos. Es especialmente importante si tiene sobrepeso. El sobrepeso aumenta el riesgo de tener enfermedad cardaca, accidente cerebrovascular, diabetes, presin arterial alta y varios tipos de cncer. Reducir la ingesta de caloras y hacer ejercicio pueden ayudarlo a bajar de [place]. El ejercicio por lo general se clasifica como de intensidad moderada o vigorosa. Para bajar de peso, la [employer] de las personas debe hacer una determinada cantidad de ejercicio de intensidad moderada o vigorosa cada semana. Ejercicio de intensidad moderada  El ejercicio de intensidad moderada es cualquier actividad que lo haga moverse lo suficiente como para quemar al menos tres veces ms energa (caloras) que si estuviera sentado. Algunos ejemplos de ejercicio de intensidad moderada son:  Caminar una milla (1,6 kilmetros) en 15 minutos.  Hacer trabajos de jardinera livianos.  Andar en bicicleta a un ritmo fcil de aguantar. La [employer] de las personas debe hacer al menos 150 minutos (2 horas y 30 minutos) por semana de ejercicio de intensidad moderada para Theatre manager su Engineer, site. Ejercicio de intensidad vigorosa El ejercicio de intensidad vigorosa es cualquier actividad que lo haga moverse lo suficiente como para quemar al menos seis veces ms caloras que si estuviera sentado. Al hacer ejercicio a esta intensidad, su nivel de esfuerzo debera ser lo suficientemente alto como para no permitirle Programmer, systems. Algunos ejemplos de ejercicio de intensidad vigorosa son:  Optometrist.  Practicar un deporte de equipo, como ftbol americano, baloncesto y ftbol.  Saltar la cuerda. La [employer] de las personas debe hacer al menos 75 minutos (1 hora y 15 minutos) por semana de  ejercicio de intensidad vigorosa para mantener su Engineer, site. Cmo puede afectarme el ejercicio? Cuando hace suficiente ejercicio como para quemar ms caloras que las que consume, pierde [place]. Tambin reduce la grasa corporal y aumenta la masa muscular. Cuanto ms msculo tenga, mayor cantidad de Nurse, children's. El ejercicio tambin:  Mejora el estado de nimo.  Reduce el estrs y las tensiones.  Mejora el estado fsico general, la flexibilidad y la resistencia.  Aumenta la fuerza sea. La cantidad de ejercicio que necesita realizar para bajar de peso depende de:  Su edad.  El tipo de ejercicio.  Cualquier afeccin de [employer].  Su capacidad fsica general. Pregntele al mdico cunto ejercicio debe realizar y qu tipos de actividades son seguras para usted. Qu medidas puedo tomar para bajar de peso? Nutricin   Principal Financial dieta como se lo haya indicado el mdico o especialista en alimentacin y nutricin (nutricionista). Esto puede incluir lo siguiente: ? Consumir menos caloras. ? Consumir ms protenas. ? Consumir menos grasas no saludables. ? Seguir una dieta que incluya frutas y verduras frescas, cereales integrales, productos lcteos semidescremados y [name]. ? Evite los alimentos con grasa, sal y azcar agregadas.  Beba gran cantidad de agua mientras hace ejercicio para evitar la deshidratacin o los golpes de Freight forwarder. Actividad  Elija una actividad que disfrute y establezca objetivos realistas. [name] puede ayudarlo a Paediatric nurse un plan de ejercicio que funcione para usted.  Haga ejercicio a una intensidad moderada o vigorosa la [employer] Financial de la Alliance. ? La intensidad de la actividad fsica puede variar de Ardelia Mems persona a Theatre manager. Puede saber qu tan intensa una rutina de ejercicios es para usted  al prestar atencin a su respiracin y latidos cardacos. La [employer] de las personas notar que su respiracin y latidos cardacos se  aceleran al Education officer, environmental ejercicio de mayor intensidad.  Haga entrenamiento de resistencia dos veces por semana, como: ? Flexiones de [employer]. ? Abdominales. ? Levantamiento de pesas. ? Uso de bandas elsticas de resistencia.  Hacer ejercicio en perodos cortos de [employer] ser tan til como los perodos largos y estructurados de ejercicio. Si tiene dificultad para encontrar tiempo para Materials engineer, trate de incluir el ejercicio en su rutina diaria. ? Levntese, estrese y camine cada a lo largo del Futures trader. ? Vaya a caminar durante su hora de almuerzo. ? Estacione el auto lejos de su lugar de destino. ? Si [place] transporte pblico, bjese una parada antes y camine el resto del camino. ? Pngase de pie y camine cada vez que hable por telfono. ? Utilice la Optometrist del ascensor o la Art gallery manager.  Use ropa cmoda y calzado con buen soporte.  No haga ejercicio en exceso que pudiera hacer que se lastime, se sienta mareado o tenga dificultad para respirar. Dnde buscar ms informacin  Departamento de Salud y [address] de los [place] (U.S. Department of Health and [employer]): ThisPath.fi  Centros para el Control y la Prevencin de Child psychotherapist for Disease Control and Prevention, CDC): FootballExhibition.com.br Comunquese con un mdico:  Antes de comenzar un nuevo programa de ejercicios.  Si tiene preguntas o inquietudes acerca de su peso.  Si tiene un problema mdico que Musician. Obtenga ayuda de inmediato si presenta alguno de los siguientes problemas al hacer ejercicio:  Lesiones.  Mareos.  Dificultad para respirar o falta de aire que no desaparecen al dejar de hacer ejercicio.  Dolor en el pecho.  Latidos cardacos rpidos. Resumen  El sobrepeso aumenta el riesgo de tener enfermedad cardaca, accidente cerebrovascular, diabetes, presin arterial alta y varios tipos de cncer.  Para perder peso debe quemar ms  caloras que las que consume.  Reducir la cantidad de caloras que consume, adems de hacer ejercicio de intensidad moderada o vigorosa todas las [place], [place] a Curator. Esta informacin no tiene Theme park manager el consejo del mdico. Asegrese de hacerle al mdico cualquier pregunta que tenga. Document Revised: 05/20/2017 Document Reviewed: 05/20/2017 Elsevier Patient Education  2020 [name].

## 2019-05-22 NOTE — Progress Notes (Signed)
Subjective:  Patient ID: Crystal Fleming, female    DOB: 23-Sep-1975  Age: 44 y.o. MRN: [id number]  CC: Hypothyroidism   HPI Crystal Fleming is a 44 year old female with a history of hypothyroidism who presents today for follow-up visit. She endorses compliance with levothyroxine and she has no concerns today She does not exercise as she has had body aches after exercising along with pain in her feet. Willing to receive a flu shot today. With regards to healthcare maintenance last mammogram was normal in 04/2018 and she is due for Pap smear next month.  Past Medical History:  Diagnosis Date  . Hypothyroid 2010    No past surgical history on file.  Family History  Problem Relation Age of Onset  . Hypertension Mother   . Breast cancer Neg Hx     Allergies  Allergen Reactions  . Penicillins     Outpatient Medications Prior to Visit  Medication Sig Dispense Refill  . levothyroxine (SYNTHROID) 137 MCG tablet Take 1 tablet (137 mcg total) by mouth daily. 30 tablet 6  . naproxen (NAPROSYN) 500 MG tablet Take 1 tablet (500 mg total) by mouth 2 (two) times daily with a meal. (Patient not taking: Reported on 05/22/2019) 30 tablet 1  . norethindrone-ethinyl estradiol (OVCON-35,BALZIVA,BRIELLYN) 0.4-35 MG-MCG tablet Take 1 tablet by mouth daily. (Patient not taking: Reported on 04/11/2018) 1 Package 1   No facility-administered medications prior to visit.     ROS Review of Systems  Constitutional: Negative for activity change, appetite change and fatigue.  HENT: Negative for congestion, sinus pressure and sore throat.   Eyes: Negative for visual disturbance.  Respiratory: Negative for cough, chest tightness, shortness of breath and wheezing.   Cardiovascular: Negative for chest pain and palpitations.  Gastrointestinal: Negative for abdominal distention, abdominal pain and constipation.  Endocrine: Negative for polydipsia.  Genitourinary: Negative for dysuria and  frequency.  Musculoskeletal: Negative for arthralgias and back pain.  Skin: Negative for rash.  Neurological: Negative for tremors, light-headedness and numbness.  Hematological: Does not bruise/bleed easily.  Psychiatric/Behavioral: Negative for agitation and behavioral problems.    Objective:  BP 135/83   Pulse 74   Ht 5' (1.524 m)   Wt 174 lb (78.9 kg)   SpO2 100%   BMI 33.98 kg/m   BP/Weight 05/22/2019 04/11/2018 02/21/2018  Systolic BP 135 142 139  Diastolic BP 83 100 87  Wt. (Lbs) 174 174 170.6  BMI 33.98 33.98 33.32      Physical Exam Constitutional:      Appearance: She is well-developed.  Neck:     Vascular: No JVD.  Cardiovascular:     Rate and Rhythm: Normal rate.     Heart sounds: Normal heart sounds. No murmur.  Pulmonary:     Effort: Pulmonary effort is normal.     Breath sounds: Normal breath sounds. No wheezing or rales.  Chest:     Chest wall: No tenderness.  Abdominal:     General: Bowel sounds are normal. There is no distension.     Palpations: Abdomen is soft. There is no mass.     Tenderness: There is no abdominal tenderness.  Musculoskeletal:        General: Normal range of motion.     Right lower leg: No edema.     Left lower leg: No edema.  Neurological:     Mental Status: She is alert and oriented to person, place, and time.  Psychiatric:  Mood and Affect: Mood normal.     CMP Latest Ref Rng & Units 09/01/2018 08/19/2017 12/24/2015  Glucose 65 - 99 mg/dL 87 89 81  BUN 6 - 24 mg/dL 14 10 10   Creatinine 0.57 - 1.00 mg/dL 0.76 0.63 0.58  Sodium 134 - 144 mmol/L 140 139 136  Potassium 3.5 - 5.2 mmol/L 4.8 4.1 4.2  Chloride 96 - 106 mmol/L 103 104 102  CO2 20 - 29 mmol/L 22 20 27   Calcium 8.7 - 10.2 mg/dL 9.4 9.3 9.1  Total Protein 6.0 - 8.5 g/dL 6.8 6.9 -  Total Bilirubin 0.0 - 1.2 mg/dL 0.9 1.4(H) -  Alkaline Phos 39 - 117 IU/L 67 58 -  AST 0 - 40 IU/L 16 12 -  ALT 0 - 32 IU/L 14 16 -    Lipid Panel     Component Value  Date/Time   CHOL 190 09/01/2018 0920   TRIG 133 09/01/2018 0920   HDL 41 09/01/2018 0920   CHOLHDL 4.6 (H) 09/01/2018 0920   CHOLHDL 4.4 12/24/2015 1154   VLDL 39 (H) 12/24/2015 1154   LDLCALC 122 (H) 09/01/2018 0920    CBC    Component Value Date/Time   WBC 6.1 12/12/2017 1148   WBC 5.5 06/16/2016 0939   RBC 4.63 12/12/2017 1148   RBC 4.62 06/16/2016 0939   HGB 14.2 12/12/2017 1148   HCT 41.1 12/12/2017 1148   PLT 336 12/12/2017 1148   MCV 89 12/12/2017 1148   MCH 30.7 12/12/2017 1148   MCH 31.2 06/16/2016 0939   MCHC 34.5 12/12/2017 1148   MCHC 34.0 06/16/2016 0939   RDW 12.3 12/12/2017 1148   LYMPHSABS 2.0 10/05/2017 1137   MONOABS 385 06/16/2016 0939   EOSABS 0.1 10/05/2017 1137   BASOSABS 0.0 10/05/2017 1137    Lab Results  Component Value Date   HGBA1C 5.5 06/16/2016   Lab Results  Component Value Date   TSH 0.936 09/01/2018     Assessment & Plan:  1. Hypothyroidism, unspecified type Controlled We will send of thyroid panel and adjust dose accordingly - T4, free - TSH - Lipid panel - Basic Metabolic Panel - VITAMIN D 25 Hydroxy (Vit-D Deficiency, Fractures)   Return in about 1 month (around 06/19/2019) for Complete physical exam.    Charlott Rakes, MD, FAAFP. [facility] and [place], [place]   05/22/2019, 10:33 AM

## 2019-05-23 ENCOUNTER — Other Ambulatory Visit: Payer: Self-pay | Admitting: Family Medicine

## 2019-05-23 LAB — BASIC METABOLIC PANEL
BUN/Creatinine Ratio: 14 (ref 9–23)
BUN: 9 mg/dL (ref 6–24)
CO2: 23 mmol/L (ref 20–29)
Calcium: 9.3 mg/dL (ref 8.7–10.2)
Chloride: 100 mmol/L (ref 96–106)
Creatinine, Ser: 0.65 mg/dL (ref 0.57–1.00)
GFR calc Af Amer: 127 mL/min/{1.73_m2} (ref >59)
GFR calc non Af Amer: 110 mL/min/{1.73_m2} (ref >59)
Glucose: 87 mg/dL (ref 65–99)
Potassium: 4.4 mmol/L (ref 3.5–5.2)
Sodium: 138 mmol/L (ref 134–144)

## 2019-05-23 LAB — LIPID PANEL
Chol/HDL Ratio: 4.3 ratio (ref 0.0–4.4)
Cholesterol, Total: 193 mg/dL (ref 100–199)
HDL: 45 mg/dL (ref >39)
LDL Chol Calc (NIH): 122 mg/dL — ABNORMAL HIGH (ref 0–99)
Triglycerides: 146 mg/dL (ref 0–149)
VLDL Cholesterol Cal: 26 mg/dL (ref 5–40)

## 2019-05-23 LAB — T4, FREE: Free T4: 1.62 ng/dL (ref 0.82–1.77)

## 2019-05-23 LAB — VITAMIN D 25 HYDROXY (VIT D DEFICIENCY, FRACTURES): Vit D, 25-Hydroxy: 13.1 ng/mL — ABNORMAL LOW (ref 30.0–100.0)

## 2019-05-23 LAB — TSH: TSH: 0.996 u[IU]/mL (ref 0.450–4.500)

## 2019-05-23 MED ORDER — ERGOCALCIFEROL 1.25 MG (50000 UT) PO CAPS: 50000.0000 [IU] | ORAL_CAPSULE | ORAL | 1 refills | Status: DC

## 2019-05-24 ENCOUNTER — Telehealth: Payer: Self-pay

## 2019-05-24 NOTE — Telephone Encounter (Signed)
-----   Message from Hoy Register, MD sent at 05/23/2019  8:07 PM EST ----- Thyroid, cholesterol are normal. Vitamin D is low and I have sent a rx for replacement to her Pharmacy. Once she has completed the 12 week course, she will need to continue OTC 1000 units daily

## 2019-05-24 NOTE — Telephone Encounter (Signed)
Patient name and DOB has been verified Patient was informed of lab results. Patient had no questions.  

## 2019-06-26 ENCOUNTER — Ambulatory Visit
Admission: RE | Admit: 2019-06-26 | Discharge: 2019-06-26 | Disposition: A | Discharge status: Home / Self Care | Payer: Self-pay | Source: Ambulatory Visit | Attending: Family Medicine | Admitting: Family Medicine

## 2019-06-26 ENCOUNTER — Other Ambulatory Visit: Payer: Self-pay

## 2019-06-26 DIAGNOSIS — Z1231 Encounter for screening mammogram for malignant neoplasm of breast: Secondary | ICD-10-CM

## 2019-07-03 ENCOUNTER — Telehealth: Payer: Self-pay

## 2019-07-03 NOTE — Telephone Encounter (Signed)
Patient name and DOB has been verified Patient was informed of lab results. Patient had no questions.  

## 2019-07-03 NOTE — Telephone Encounter (Signed)
-----   Message from Hoy Register, MD sent at 06/26/2019  5:32 PM EDT ----- Mammogram is negative for malignancy

## 2019-09-25 ENCOUNTER — Ambulatory Visit: Payer: Self-pay | Attending: Family Medicine | Admitting: Family Medicine

## 2019-09-25 ENCOUNTER — Encounter: Payer: Self-pay | Admitting: Family Medicine

## 2019-09-25 ENCOUNTER — Other Ambulatory Visit: Payer: Self-pay

## 2019-09-25 DIAGNOSIS — E039 Hypothyroidism, unspecified: Secondary | ICD-10-CM

## 2019-09-25 MED ORDER — LEVOTHYROXINE SODIUM 137 MCG PO TABS: 137.0000 ug | ORAL_TABLET | Freq: Every day | ORAL | 6 refills | Status: DC

## 2019-09-25 NOTE — Progress Notes (Signed)
Virtual Visit via Telephone Note  I connected with Crystal Fleming, on 09/25/2019 at 9:19 AM by telephone due to the COVID-19 pandemic and verified that I am speaking with the correct person using two identifiers.   Consent: I discussed the limitations, risks, security and privacy concerns of performing an evaluation and management service by telephone and the availability of in person appointments. I also discussed with the patient that there may be a patient responsible charge related to this service. The patient expressed understanding and agreed to proceed.   Location of Patient: Home  Location of Provider: Clinic   Persons participating in Telemedicine visit: Rigby Arzate Dennie Fetters Yasmin ID #[id number] - interpreter Dr. Alvis Lemmings     History of Present Illness: Crystal Fleming is a 44 year old female with a history of hypothyroidism who presents today for follow-up visit. She reports doing well on levothyroxine and is tolerating her medications okay, last TSH in 05/2019 was normal.  She is also on her weekly dose of Drisdol for vitamin D deficiency. She has no additional concerns today.  Past Medical History:  Diagnosis Date  . Hypothyroid 2010   Allergies  Allergen Reactions  . Penicillins     Current Outpatient Medications on File Prior to Visit  Medication Sig Dispense Refill  . ergocalciferol (DRISDOL) 1.25 MG (50000 UT) capsule Take 1 capsule (50,000 Units total) by mouth once a week. 12 capsule 1  . levothyroxine (SYNTHROID) 137 MCG tablet Take 1 tablet (137 mcg total) by mouth daily. 30 tablet 6  . naproxen (NAPROSYN) 500 MG tablet Take 1 tablet (500 mg total) by mouth 2 (two) times daily with a meal. (Patient not taking: Reported on 05/22/2019) 30 tablet 1  . norethindrone-ethinyl estradiol (OVCON-35,BALZIVA,BRIELLYN) 0.4-35 MG-MCG tablet Take 1 tablet by mouth daily. (Patient not taking: Reported on 04/11/2018) 1 Package 1    No current facility-administered medications on file prior to visit.    Observations/Objective: Awake, alert, oriented x3 Not in acute distress  Lab Results  Component Value Date   TSH 0.996 05/22/2019    Assessment and Plan: 1. Hypothyroidism, unspecified type Controlled - levothyroxine (SYNTHROID) 137 MCG tablet; Take 1 tablet (137 mcg total) by mouth daily.  Dispense: 30 tablet; Refill: 6   Follow Up Instructions: Return in about 3 months (around 12/26/2019) for follow up of Hypothyroidism.    I discussed the assessment and treatment plan with the patient. The patient was provided an opportunity to ask questions and all were answered. The patient agreed with the plan and demonstrated an understanding of the instructions.   The patient was advised to call back or seek an in-person evaluation if the symptoms worsen or if the condition fails to improve as anticipated.     I provided 11 minutes total of non-face-to-face time during this encounter including median intraservice time, reviewing previous notes, investigations, ordering medications, medical decision making, coordinating care and patient verbalized understanding at the end of the visit.     Hoy Register, MD, FAAFP. [facility] [place], [name] [phone]   09/25/2019, 9:19 AM

## 2019-09-28 ENCOUNTER — Other Ambulatory Visit: Payer: No Typology Code available for payment source

## 2019-11-14 ENCOUNTER — Other Ambulatory Visit: Payer: Self-pay

## 2019-11-14 ENCOUNTER — Ambulatory Visit: Payer: Self-pay

## 2019-12-13 ENCOUNTER — Encounter: Payer: Self-pay | Admitting: Family Medicine

## 2019-12-13 ENCOUNTER — Ambulatory Visit: Payer: Self-pay | Attending: Family Medicine | Admitting: Family Medicine

## 2019-12-13 ENCOUNTER — Other Ambulatory Visit: Payer: Self-pay

## 2019-12-13 VITALS — BP 128/80 | HR 69 | Ht 60.0 in | Wt 171.8 lb

## 2019-12-13 DIAGNOSIS — Z131 Encounter for screening for diabetes mellitus: Secondary | ICD-10-CM

## 2019-12-13 DIAGNOSIS — Z1159 Encounter for screening for other viral diseases: Secondary | ICD-10-CM

## 2019-12-13 DIAGNOSIS — E039 Hypothyroidism, unspecified: Secondary | ICD-10-CM

## 2019-12-13 LAB — POCT GLYCOSYLATED HEMOGLOBIN (HGB A1C): HbA1c, POC (controlled diabetic range): 5.3 % (ref 0.0–7.0)

## 2019-12-13 NOTE — Progress Notes (Signed)
Subjective:  Patient ID: Crystal Fleming, female    DOB: December 15, 1975  Age: 44 y.o. MRN: [id number]  CC: Hypothyroidism   HPI Crystal Fleming is a 44 year old female with a history of hypothyroidism who presents today for follow-up visit. She has noticed an increased appetite for the last 2 months but surprisingly she has lost 3 pounds since her last visit 6 months ago.  Her last thyroid panel was normal in 05/2019. Compliant with levothyroxine and she has no adverse effects from her medications. She has no additional concerns today.  Past Medical History:  Diagnosis Date  . Hypothyroid 2010    History reviewed. No pertinent surgical history.  Family History  Problem Relation Age of Onset  . Hypertension Mother   . Breast cancer Neg Hx     Allergies  Allergen Reactions  . Penicillins     Outpatient Medications Prior to Visit  Medication Sig Dispense Refill  . ergocalciferol (DRISDOL) 1.25 MG (50000 UT) capsule Take 1 capsule (50,000 Units total) by mouth once a week. 12 capsule 1  . levothyroxine (SYNTHROID) 137 MCG tablet Take 1 tablet (137 mcg total) by mouth daily. 30 tablet 6  . naproxen (NAPROSYN) 500 MG tablet Take 1 tablet (500 mg total) by mouth 2 (two) times daily with a meal. (Patient not taking: Reported on 05/22/2019) 30 tablet 1   No facility-administered medications prior to visit.     ROS Review of Systems  Constitutional: Negative for activity change, appetite change and fatigue.  HENT: Negative for congestion, sinus pressure and sore throat.   Eyes: Negative for visual disturbance.  Respiratory: Negative for cough, chest tightness, shortness of breath and wheezing.   Cardiovascular: Negative for chest pain and palpitations.  Gastrointestinal: Negative for abdominal distention, abdominal pain and constipation.  Endocrine: Negative for polydipsia.  Genitourinary: Negative for dysuria and frequency.  Musculoskeletal: Negative for  arthralgias and back pain.  Skin: Negative for rash.  Neurological: Negative for tremors, light-headedness and numbness.  Hematological: Does not bruise/bleed easily.  Psychiatric/Behavioral: Negative for agitation and behavioral problems.    Objective:  BP 128/80   Pulse 69   Ht 5' (1.524 m)   Wt 171 lb 12.8 oz (77.9 kg)   SpO2 100%   BMI 33.55 kg/m   BP/Weight 12/13/2019 05/22/2019 04/11/2018  Systolic BP 128 135 142  Diastolic BP 80 83 100  Wt. (Lbs) 171.8 174 174  BMI 33.55 33.98 33.98      Physical Exam Constitutional:      Appearance: She is well-developed.  Neck:     Vascular: No JVD.  Cardiovascular:     Rate and Rhythm: Normal rate.     Heart sounds: Normal heart sounds. No murmur heard.   Pulmonary:     Effort: Pulmonary effort is normal.     Breath sounds: Normal breath sounds. No wheezing or rales.  Chest:     Chest wall: No tenderness.  Abdominal:     General: Bowel sounds are normal. There is no distension.     Palpations: Abdomen is soft. There is no mass.     Tenderness: There is no abdominal tenderness.  Musculoskeletal:        General: Normal range of motion.     Right lower leg: No edema.     Left lower leg: No edema.  Neurological:     Mental Status: She is alert and oriented to person, place, and time.  Psychiatric:  Mood and Affect: Mood normal.     CMP Latest Ref Rng & Units 05/22/2019 09/01/2018 08/19/2017  Glucose 65 - 99 mg/dL 87 87 89  BUN 6 - 24 mg/dL 9 14 10   Creatinine 0.57 - 1.00 mg/dL 0.93 2.35  Sodium 134 - 144 mmol/L 138 140 139  Potassium 3.5 - 5.2 mmol/L 4.4 4.8 4.1  Chloride 96 - 106 mmol/L 100 103 104  CO2 20 - 29 mmol/L 23 22 20   Calcium 8.7 - 10.2 mg/dL 9.3 9.4 9.3  Total Protein 6.0 - 8.5 g/dL - 6.8 6.9  Total Bilirubin 0.0 - 1.2 mg/dL - 0.9 5.73)  Alkaline Phos 39 - 117 IU/L - 67 58  AST 0 - 40 IU/L - 16 12  ALT 0 - 32 IU/L - 14 16    Lipid Panel     Component Value Date/Time   CHOL 193 05/22/2019  1126   TRIG 146 05/22/2019 1126   HDL 45 05/22/2019 1126   CHOLHDL 4.3 05/22/2019 1126   CHOLHDL 4.4 12/24/2015 1154   VLDL 39 (H) 12/24/2015 1154   LDLCALC 122 (H) 05/22/2019 1126    CBC    Component Value Date/Time   WBC 6.1 12/12/2017 1148   WBC 5.5 06/16/2016 0939   RBC 4.63 12/12/2017 1148   RBC 4.62 06/16/2016 0939   HGB 14.2 12/12/2017 1148   HCT 41.1 12/12/2017 1148   PLT 336 12/12/2017 1148   MCV 89 12/12/2017 1148   MCH 30.7 12/12/2017 1148   MCH 31.2 06/16/2016 0939   MCHC 34.5 12/12/2017 1148   MCHC 34.0 06/16/2016 0939   RDW 12.3 12/12/2017 1148   LYMPHSABS 2.0 10/05/2017 1137   MONOABS 385 06/16/2016 0939   EOSABS 0.1 10/05/2017 1137   BASOSABS 0.0 10/05/2017 1137    Lab Results  Component Value Date   HGBA1C 5.3 12/13/2019    Assessment & Plan:  1. Screening for diabetes mellitus A1c is normal at 5.3 - POCT glycosylated hemoglobin (Hb A1C)  2. Hypothyroidism, unspecified type Controlled Continue levothyroxine We will check thyroid panel especially in the setting of increased appetite We have discussed healthy snacking, staying busy and active to reduce cravings - T4, free - TSH - Basic Metabolic Panel  3. Need for hepatitis C screening test - HCV RNA quant rflx ultra or genotyp([facility])  There are no diagnoses linked to this encounter.  No orders of the defined types were placed in this encounter.   Follow-up: Return in about 1 month (around 01/12/2020) for PAP smear.       02/12/2020, MD, FAAFP. [facility] [place], [facility] [place]   12/13/2019, 11:29 AM

## 2019-12-14 ENCOUNTER — Other Ambulatory Visit: Payer: Self-pay | Admitting: Family Medicine

## 2019-12-14 DIAGNOSIS — E039 Hypothyroidism, unspecified: Secondary | ICD-10-CM

## 2019-12-15 LAB — BASIC METABOLIC PANEL
BUN/Creatinine Ratio: 17 (ref 9–23)
BUN: 12 mg/dL (ref 6–24)
CO2: 25 mmol/L (ref 20–29)
Calcium: 9.1 mg/dL (ref 8.7–10.2)
Chloride: 101 mmol/L (ref 96–106)
Creatinine, Ser: 0.69 mg/dL (ref 0.57–1.00)
GFR calc Af Amer: 123 mL/min/{1.73_m2} (ref >59)
GFR calc non Af Amer: 107 mL/min/{1.73_m2} (ref >59)
Glucose: 87 mg/dL (ref 65–99)
Potassium: 4.3 mmol/L (ref 3.5–5.2)
Sodium: 137 mmol/L (ref 134–144)

## 2019-12-15 LAB — HCV RNA QUANT RFLX ULTRA OR GENOTYP: HCV Quant Baseline: NOT DETECTED IU/mL

## 2019-12-15 LAB — TSH: TSH: 0.187 u[IU]/mL — ABNORMAL LOW (ref 0.450–4.500)

## 2019-12-15 LAB — T4, FREE: Free T4: 1.67 ng/dL (ref 0.82–1.77)

## 2019-12-20 ENCOUNTER — Telehealth: Payer: Self-pay

## 2019-12-20 NOTE — Telephone Encounter (Signed)
-----   Message from Hoy Register, MD sent at 12/14/2019  5:05 PM EDT ----- Thyroid level is slightly abnormal. Please advise to take Levothyroxine on an empty stomach first thing in the morning and levels will be repeated at her next visit. No regimen changes yet.

## 2019-12-20 NOTE — Telephone Encounter (Signed)
Patient name and DOB has been verified Patient was informed of lab results. Patient had no questions.  

## 2020-02-06 ENCOUNTER — Encounter: Payer: Self-pay | Admitting: Family Medicine

## 2020-02-06 ENCOUNTER — Other Ambulatory Visit: Payer: Self-pay

## 2020-02-06 ENCOUNTER — Ambulatory Visit: Payer: Self-pay | Attending: Family Medicine | Admitting: Family Medicine

## 2020-02-06 VITALS — BP 137/83 | HR 81 | Ht 62.0 in | Wt 168.8 lb

## 2020-02-06 DIAGNOSIS — M62838 Other muscle spasm: Secondary | ICD-10-CM

## 2020-02-06 DIAGNOSIS — Z113 Encounter for screening for infections with a predominantly sexual mode of transmission: Secondary | ICD-10-CM

## 2020-02-06 DIAGNOSIS — Z124 Encounter for screening for malignant neoplasm of cervix: Secondary | ICD-10-CM

## 2020-02-06 MED ORDER — DICLOFENAC SODIUM 1 % EX GEL: 2.0000 g | Freq: Four times a day (QID) | CUTANEOUS | 0 refills | Status: DC

## 2020-02-06 NOTE — Progress Notes (Signed)
Subjective:  Patient ID: Crystal Fleming, female    DOB: 11-21-1975  Age: 44 y.o. MRN: [id number]  CC: Gynecologic Exam   HPI Crystal Fleming Crystal Fleming is a 44 year old female with a history of hypothyroidism who presents for a gynecologic exam. She had her mammogram in 06/2019 and this was negative for malignancy. She also complains of left trapezius muscle pain which she has had for the last 1 week.  Denies heavy lifting or preceding trauma and pain is described as moderate.  She has not used any OTC medications for pain.  Past Medical History:  Diagnosis Date  . Hypothyroid 2010    No past surgical history on file.  Family History  Problem Relation Age of Onset  . Hypertension Mother   . Breast cancer Neg Hx     Allergies  Allergen Reactions  . Penicillins     Outpatient Medications Prior to Visit  Medication Sig Dispense Refill  . ergocalciferol (DRISDOL) 1.25 MG (50000 UT) capsule Take 1 capsule (50,000 Units total) by mouth once a week. 12 capsule 1  . levothyroxine (SYNTHROID) 137 MCG tablet Take 1 tablet (137 mcg total) by mouth daily. 30 tablet 6  . naproxen (NAPROSYN) 500 MG tablet Take 1 tablet (500 mg total) by mouth 2 (two) times daily with a meal. (Patient not taking: Reported on 05/22/2019) 30 tablet 1   No facility-administered medications prior to visit.     ROS Review of Systems  Constitutional: Negative for activity change, appetite change and fatigue.  HENT: Negative for congestion, sinus pressure and sore throat.   Eyes: Negative for visual disturbance.  Respiratory: Negative for cough, chest tightness, shortness of breath and wheezing.   Cardiovascular: Negative for chest pain and palpitations.  Gastrointestinal: Negative for abdominal distention, abdominal pain and constipation.  Endocrine: Negative for polydipsia.  Genitourinary: Negative for dysuria and frequency.  Musculoskeletal:       See HPI  Skin: Negative for rash.    Neurological: Negative for tremors, light-headedness and numbness.  Hematological: Does not bruise/bleed easily.  Psychiatric/Behavioral: Negative for agitation and behavioral problems.    Objective:  BP 137/83   Pulse 81   Ht 5\' 2"  (1.575 m)   Wt 168 lb 12.8 oz (76.6 kg)   SpO2 99%   BMI 30.87 kg/m   BP/Weight 02/06/2020 12/13/2019 05/22/2019  Systolic BP 137 128 135  Diastolic BP 83 80 83  Wt. (Lbs) 168.8 171.8 174  BMI 30.87 33.55 33.98      Physical Exam Constitutional:      Appearance: She is well-developed.  Neck:     Vascular: No JVD.  Cardiovascular:     Rate and Rhythm: Normal rate.     Heart sounds: Normal heart sounds. No murmur heard.   Pulmonary:     Effort: Pulmonary effort is normal.     Breath sounds: Normal breath sounds. No wheezing or rales.  Chest:     Chest wall: No tenderness.  Abdominal:     General: Bowel sounds are normal. There is no distension.     Palpations: Abdomen is soft. There is no mass.     Tenderness: There is no abdominal tenderness.  Genitourinary:    Comments: External genitalia, vagina, cervix, adnexa-normal Musculoskeletal:        General: Normal range of motion.     Right lower leg: No edema.     Left lower leg: No edema.  Neurological:     Mental Status: She is alert and  oriented to person, place, and time.  Psychiatric:        Mood and Affect: Mood normal.     CMP Latest Ref Rng & Units 12/13/2019 05/22/2019 09/01/2018  Glucose 65 - 99 mg/dL 87 87 87  BUN 6 - 24 mg/dL 12 9 14   Creatinine 0.57 - 1.00 mg/dL 2.02 5.42  Sodium 134 - 144 mmol/L 137 138 140  Potassium 3.5 - 5.2 mmol/L 4.3 4.4 4.8  Chloride 96 - 106 mmol/L 101 100 103  CO2 20 - 29 mmol/L 25 23 22   Calcium 8.7 - 10.2 mg/dL 9.1 9.3 9.4  Total Protein 6.0 - 8.5 g/dL - - 6.8  Total Bilirubin 0.0 - 1.2 mg/dL - - 0.9  Alkaline Phos 39 - 117 IU/L - - 67  AST 0 - 40 IU/L - - 16  ALT 0 - 32 IU/L - - 14    Lipid Panel     Component Value Date/Time    CHOL 193 05/22/2019 1126   TRIG 146 05/22/2019 1126   HDL 45 05/22/2019 1126   CHOLHDL 4.3 05/22/2019 1126   CHOLHDL 4.4 12/24/2015 1154   VLDL 39 (H) 12/24/2015 1154   LDLCALC 122 (H) 05/22/2019 1126    CBC    Component Value Date/Time   WBC 6.1 12/12/2017 1148   WBC 5.5 06/16/2016 0939   RBC 4.63 12/12/2017 1148   RBC 4.62 06/16/2016 0939   HGB 14.2 12/12/2017 1148   HCT 41.1 12/12/2017 1148   PLT 336 12/12/2017 1148   MCV 89 12/12/2017 1148   MCH 30.7 12/12/2017 1148   MCH 31.2 06/16/2016 0939   MCHC 34.5 12/12/2017 1148   MCHC 34.0 06/16/2016 0939   RDW 12.3 12/12/2017 1148   LYMPHSABS 2.0 10/05/2017 1137   MONOABS 385 06/16/2016 0939   EOSABS 0.1 10/05/2017 1137   BASOSABS 0.0 10/05/2017 1137    Lab Results  Component Value Date   HGBA1C 5.3 12/13/2019    Assessment & Plan:  1. Muscle spasm - diclofenac Sodium (VOLTAREN) 1 % GEL; Apply 2 g topically 4 (four) times daily.  Dispense: 100 g; Refill: 0  2. Screening for cervical cancer - Cytology - PAP([place])  3. Screening for STD (sexually transmitted disease) - Cervicovaginal ancillary only    Meds ordered this encounter  Medications  . diclofenac Sodium (VOLTAREN) 1 % GEL    Sig: Apply 2 g topically 4 (four) times daily.    Dispense:  100 g    Refill:  0    Follow-up: No follow-ups on file.       12/06/2017, MD, FAAFP. [facility] [place], [facility] [place]   02/06/2020, 4:59 PM

## 2020-02-07 LAB — CERVICOVAGINAL ANCILLARY ONLY
Bacterial Vaginitis (gardnerella): NEGATIVE
Candida Glabrata: NEGATIVE
Candida Vaginitis: NEGATIVE
Chlamydia: NEGATIVE
Comment: NEGATIVE
Comment: NEGATIVE
Comment: NEGATIVE
Comment: NEGATIVE
Comment: NEGATIVE
Comment: NORMAL
Neisseria Gonorrhea: NEGATIVE
Trichomonas: NEGATIVE

## 2020-02-11 ENCOUNTER — Telehealth: Payer: Self-pay

## 2020-02-11 LAB — CYTOLOGY - PAP
Comment: NEGATIVE
Diagnosis: NEGATIVE
Diagnosis: REACTIVE
High risk HPV: NEGATIVE

## 2020-02-11 NOTE — Telephone Encounter (Signed)
-----   Message from Hoy Register, MD sent at 02/11/2020  4:48 PM EST ----- Pap smear is negative for malignancy

## 2020-02-11 NOTE — Telephone Encounter (Signed)
Patient name and DOB has been verified Patient was informed of lab results. Patient had no questions.  

## 2020-04-01 ENCOUNTER — Other Ambulatory Visit: Payer: Self-pay

## 2020-04-01 ENCOUNTER — Telehealth: Payer: Self-pay | Admitting: Family Medicine

## 2020-04-01 DIAGNOSIS — E039 Hypothyroidism, unspecified: Secondary | ICD-10-CM

## 2020-04-01 MED ORDER — LEVOTHYROXINE SODIUM 137 MCG PO TABS: 137.0000 ug | ORAL_TABLET | Freq: Every day | ORAL | 1 refills | Status: DC

## 2020-04-01 NOTE — Telephone Encounter (Signed)
Pt requesting Refill of levothyroxine (SYNTHROID) 137 MCG tablet [[id number]]  Pharmacy of choice:  [address] [place], [name] [address].   Please advise and thank you

## 2020-04-01 NOTE — Telephone Encounter (Signed)
Refill has been sent and patient has been set an appointment for 04/22/20

## 2020-04-22 ENCOUNTER — Ambulatory Visit: Payer: No Typology Code available for payment source | Admitting: Family Medicine

## 2020-04-23 ENCOUNTER — Encounter: Payer: Self-pay | Admitting: Physician Assistant

## 2020-04-23 ENCOUNTER — Ambulatory Visit: Payer: Self-pay | Attending: Physician Assistant | Admitting: Physician Assistant

## 2020-04-23 ENCOUNTER — Other Ambulatory Visit: Payer: Self-pay

## 2020-04-23 DIAGNOSIS — Z789 Other specified health status: Secondary | ICD-10-CM

## 2020-04-23 DIAGNOSIS — E039 Hypothyroidism, unspecified: Secondary | ICD-10-CM

## 2020-04-23 MED ORDER — LEVOTHYROXINE SODIUM 137 MCG PO TABS: 137.0000 ug | ORAL_TABLET | Freq: Every day | ORAL | 3 refills | Status: DC

## 2020-04-23 NOTE — Progress Notes (Signed)
Virtual Visit via Telephone Note  I connected with Rushie Goltz on 04/23/20 at 10:30 AM EST by telephone and verified that I am speaking with the correct person using two identifiers.  Location: Patient: Danella Deis Provider: Georgian Co  I discussed the limitations, risks, security and privacy concerns of performing an evaluation and management service by telephone and the availability of in person appointments. I also discussed with the patient that there may be a patient responsible charge related to this service. The patient expressed understanding and agreed to proceed.  PATIENT visit by telephone virtually in the context of Covid-19 pandemic. Patient location:  home My Location:  [facility] office Persons on the call:  Georgian Co, PA-C, screened by Carolynne Edouard, interpreter  History of Present Illness: Needs thyroid meds RF.  No s/sx of it being uncontrolled.  No hair loss/fatigue/palpitations.  Feels good overall.  Taking .  Compliant.  No other issues   Observations/Objective:  NAD.  A&Ox3   Assessment and Plan: 1. Hypothyroidism, unspecified type Labs in the next 2-3 weeks - Thyroid Panel With TSH; Future - levothyroxine (SYNTHROID) 137 MCG tablet; Take 1 tablet (137 mcg total) by mouth daily.  Dispense: 30 tablet; Refill: 3   2. Language barrier pacific interpreters used and additional time performing visit was required.   Follow Up Instructions: See PCP in 4-6 months   I discussed the assessment and treatment plan with the patient. The patient was provided an opportunity to ask questions and all were answered. The patient agreed with the plan and demonstrated an understanding of the instructions.   The patient was advised to call back or seek an in-person evaluation if the symptoms worsen or if the condition fails to improve as anticipated.  I provided 12 minutes of non-face-to-face time during this encounter.   Georgian Co,  PA-C  Patient ID: Rushie Goltz, female   DOB: [birth year], 45 y.o.   MRN: [id number]

## 2020-04-24 ENCOUNTER — Ambulatory Visit: Payer: No Typology Code available for payment source | Attending: Family Medicine

## 2020-04-24 ENCOUNTER — Other Ambulatory Visit: Payer: Self-pay

## 2020-04-24 DIAGNOSIS — E039 Hypothyroidism, unspecified: Secondary | ICD-10-CM

## 2020-04-25 LAB — THYROID PANEL WITH TSH
Free Thyroxine Index: 2.5 (ref 1.2–4.9)
T3 Uptake Ratio: 29 % (ref 24–39)
T4, Total: 8.5 ug/dL (ref 4.5–12.0)
TSH: 1.36 u[IU]/mL (ref 0.450–4.500)

## 2020-05-19 ENCOUNTER — Ambulatory Visit: Payer: No Typology Code available for payment source | Attending: Family Medicine

## 2020-06-14 ENCOUNTER — Other Ambulatory Visit: Payer: Self-pay | Admitting: Family Medicine

## 2020-06-14 DIAGNOSIS — E039 Hypothyroidism, unspecified: Secondary | ICD-10-CM

## 2020-06-14 NOTE — Telephone Encounter (Signed)
Pharmacy requesting Euthyrox. Pt has been taking Synthroid. Please review if refill appropriate.

## 2020-08-14 ENCOUNTER — Encounter: Payer: Self-pay | Admitting: Family Medicine

## 2020-08-14 ENCOUNTER — Ambulatory Visit: Payer: Self-pay | Attending: Family Medicine | Admitting: Family Medicine

## 2020-08-14 ENCOUNTER — Other Ambulatory Visit: Payer: Self-pay

## 2020-08-14 ENCOUNTER — Other Ambulatory Visit: Payer: Self-pay | Admitting: Obstetrics and Gynecology

## 2020-08-14 DIAGNOSIS — Z1231 Encounter for screening mammogram for malignant neoplasm of breast: Secondary | ICD-10-CM

## 2020-08-14 DIAGNOSIS — E039 Hypothyroidism, unspecified: Secondary | ICD-10-CM

## 2020-08-14 MED ORDER — LEVOTHYROXINE SODIUM 137 MCG PO TABS: 137.0000 ug | ORAL_TABLET | Freq: Every day | ORAL | 3 refills | Status: DC

## 2020-08-14 NOTE — Progress Notes (Signed)
   Virtual Visit via Telephone Note  I connected with Crystal Fleming, on 08/14/2020 at 8:33 AM by telephone due to the COVID-19 pandemic and verified that I am speaking with the correct person using two identifiers.   Consent: I discussed the limitations, risks, security and privacy concerns of performing an evaluation and management service by telephone and the availability of in person appointments. I also discussed with the patient that there may be a patient responsible charge related to this service. The patient expressed understanding and agreed to proceed.   Location of Patient: Home  Location of Provider: Clinic   Persons participating in Telemedicine visit: Crystal Fleming Dr. Alvis Lemmings Interpreter     History of Present Illness: 45 year old female with a history of hypothyroidism who is seen for chronic disease management. Last thyroid panel was in 04/2020 and was normal.  She endorses compliance with levothyroxine and has no adverse effects from her medication. She denies planes of chest pain, dyspnea and has no additional concerns.  Past Medical History:  Diagnosis Date  . Hypothyroid 2010   Allergies  Allergen Reactions  . Penicillins     Current Outpatient Medications on File Prior to Visit  Medication Sig Dispense Refill  . diclofenac Sodium (VOLTAREN) 1 % GEL Apply 2 g topically 4 (four) times daily. 100 g 0  . ergocalciferol (DRISDOL) 1.25 MG (50000 UT) capsule Take 1 capsule (50,000 Units total) by mouth once a week. 12 capsule 1  . levothyroxine (SYNTHROID) 137 MCG tablet Take 1 tablet (137 mcg total) by mouth daily. 30 tablet 3  . naproxen (NAPROSYN) 500 MG tablet Take 1 tablet (500 mg total) by mouth 2 (two) times daily with a meal. (Patient not taking: Reported on 05/22/2019) 30 tablet 1   No current facility-administered medications on file prior to visit.    ROS: See HPI  Observations/Objective: Awake, alert, oriented x3 Not  in acute distress Normal mood  Assessment and Plan: 1. Hypothyroidism, unspecified type Controlled - levothyroxine (SYNTHROID) 137 MCG tablet; Take 1 tablet (137 mcg total) by mouth daily.  Dispense: 30 tablet; Refill: 3   Follow Up Instructions: 3 months for chronic disease management   I discussed the assessment and treatment plan with the patient. The patient was provided an opportunity to ask questions and all were answered. The patient agreed with the plan and demonstrated an understanding of the instructions.   The patient was advised to call back or seek an in-person evaluation if the symptoms worsen or if the condition fails to improve as anticipated.     I provided 11 minutes total of non-face-to-face time during this encounter.   Hoy Register, MD, FAAFP. [facility] [place], [name] [phone]   08/14/2020, 8:33 AM

## 2020-09-04 ENCOUNTER — Ambulatory Visit
Admission: RE | Admit: 2020-09-04 | Discharge: 2020-09-04 | Disposition: A | Discharge status: Home / Self Care | Payer: No Typology Code available for payment source | Source: Ambulatory Visit | Attending: Obstetrics and Gynecology | Admitting: Obstetrics and Gynecology

## 2020-09-04 ENCOUNTER — Other Ambulatory Visit: Payer: Self-pay

## 2020-09-04 ENCOUNTER — Ambulatory Visit: Payer: Self-pay | Admitting: *Deleted

## 2020-09-04 VITALS — BP 136/88 | Wt 168.8 lb

## 2020-09-04 DIAGNOSIS — Z1239 Encounter for other screening for malignant neoplasm of breast: Secondary | ICD-10-CM

## 2020-09-04 DIAGNOSIS — Z1231 Encounter for screening mammogram for malignant neoplasm of breast: Secondary | ICD-10-CM

## 2020-09-04 NOTE — Patient Instructions (Signed)
Explained breast self awareness with Rushie Goltz. Patient did not need a Pap smear today due to last Pap smear and HPV typing was 02/06/2020. Let her know [facility] will cover Pap smears and HPV typing every 5 years unless has a history of abnormal Pap smears. Referred patient to the [facility] for a screening mammogram on the mobile unit. Appointment scheduled Thursday, September 04, 2020 at 0900. Patient escorted to the mobile unit following [facility] appointment for her screening mammogram. Let patient know the Breast Center will follow up with her within the next couple weeks with results of her mammogram by letter or phone. Crystal Fleming verbalized understanding.  Laquinton Bihm, Kathaleen Maser, RN 8:23 AM

## 2020-09-04 NOTE — Progress Notes (Signed)
Ms. Crystal Fleming is a 45 y.o. female who presents to [facility] clinic today with no complaints.    Pap Smear: Pap smear not completed today. Last Pap smear was 02/06/2020 at [facility] and was normal with negative HPV. Per patient has no history of an abnormal Pap smear. Last Pap smear result is available in Epic.   Physical exam: Breasts Breasts symmetrical. No skin abnormalities bilateral breasts. No nipple retraction bilateral breasts. No nipple discharge bilateral breasts. No lymphadenopathy. No lumps palpated bilateral breasts. No complaints of pain or tenderness on exam.  MS DIGITAL SCREENING BILATERAL  Result Date: 02/28/2017 CLINICAL DATA:  Screening. EXAM: DIGITAL SCREENING BILATERAL MAMMOGRAM WITH CAD COMPARISON:  Previous exam(s). ACR Breast Density Category c: The breast tissue is heterogeneously dense, which may obscure small masses. FINDINGS: There are no findings suspicious for malignancy. Images were processed with CAD. IMPRESSION: No mammographic evidence of malignancy. A result letter of this screening mammogram will be mailed directly to the patient. RECOMMENDATION: Screening mammogram in one year. (Code:SM-B-01Y) BI-RADS CATEGORY  1: Negative. Electronically Signed   By: Gerome Sam III M.D   On: 02/28/2017 08:15   MS DIGITAL SCREENING TOMO BILATERAL  Result Date: 06/26/2019 CLINICAL DATA:  Screening. EXAM: DIGITAL SCREENING BILATERAL MAMMOGRAM WITH TOMO AND CAD COMPARISON:  Previous exam(s). ACR Breast Density Category d: The breast tissue is extremely dense, which lowers the sensitivity of mammography FINDINGS: There are no findings suspicious for malignancy. Images were processed with CAD. IMPRESSION: No mammographic evidence of malignancy. A result letter of this screening mammogram will be mailed directly to the patient. RECOMMENDATION: Screening mammogram in one year. (Code:SM-B-01Y) BI-RADS CATEGORY  1: Negative.  Electronically Signed   By: Edwin Cap M.D.   On: 06/26/2019 12:55   MS DIGITAL SCREENING TOMO BILATERAL  Result Date: 04/11/2018 CLINICAL DATA:  Screening. EXAM: DIGITAL SCREENING BILATERAL MAMMOGRAM WITH TOMO AND CAD COMPARISON:  Previous exam(s). ACR Breast Density Category d: The breast tissue is extremely dense, which lowers the sensitivity of mammography FINDINGS: There are no findings suspicious for malignancy. Images were processed with CAD. IMPRESSION: No mammographic evidence of malignancy. A result letter of this screening mammogram will be mailed directly to the patient. RECOMMENDATION: Screening mammogram in one year. (Code:SM-B-01Y) BI-RADS CATEGORY  1: Negative. Electronically Signed   By: Edwin Cap M.D.   On: 04/11/2018 16:49    Pelvic/Bimanual Pap is not indicated today per [facility] guidelines.   Smoking History: Patient has never smoked.   Patient Navigation: Patient education provided. Access to services provided for patient through [place] program. Spanish interpreter [name] from [facility] provided.    Breast and Cervical Cancer Risk Assessment: Patient does not have family history of breast cancer, known genetic mutations, or radiation treatment to the chest before age 34. Patient does not have history of cervical dysplasia, immunocompromised, or DES exposure in-utero.  Risk Assessment    Risk Scores      09/04/2020 04/11/2018   Last edited by: Narda Rutherford, LPN Stoney Bang H, LPN   5-year risk: 0.3 % 0.3 %   Lifetime risk: 4.6 % 4.7 %          A: [facility] exam without pap smear No complaints.  P: Referred patient to the [facility] for a screening mammogram on the mobile unit. Appointment scheduled Thursday, September 04, 2020 at 0900.  Priscille Heidelberg, RN 09/04/2020 8:22 AM

## 2020-10-27 ENCOUNTER — Other Ambulatory Visit: Payer: Self-pay | Admitting: Pharmacist

## 2020-10-27 ENCOUNTER — Ambulatory Visit: Payer: No Typology Code available for payment source | Attending: Family Medicine

## 2020-10-27 ENCOUNTER — Other Ambulatory Visit: Payer: Self-pay

## 2020-10-27 DIAGNOSIS — E039 Hypothyroidism, unspecified: Secondary | ICD-10-CM

## 2020-10-27 MED ORDER — LEVOTHYROXINE SODIUM 137 MCG PO TABS: 137.0000 ug | ORAL_TABLET | Freq: Every day | ORAL | 1 refills | Status: DC

## 2020-12-17 ENCOUNTER — Ambulatory Visit: Payer: Self-pay | Attending: Family Medicine | Admitting: Family Medicine

## 2020-12-17 ENCOUNTER — Encounter: Payer: Self-pay | Admitting: Family Medicine

## 2020-12-17 ENCOUNTER — Other Ambulatory Visit: Payer: Self-pay

## 2020-12-17 VITALS — BP 127/82 | HR 76 | Resp 16 | Wt 168.6 lb

## 2020-12-17 DIAGNOSIS — E039 Hypothyroidism, unspecified: Secondary | ICD-10-CM

## 2020-12-17 NOTE — Progress Notes (Signed)
Subjective:  Patient ID: Crystal Fleming, female    DOB: 18-May-1975  Age: 45 y.o. MRN: [id number]  CC: Hypothyroidism and Follow-up   HPI Crystal Fleming is a 45 y.o. year old female with a history of hypothyroidism who presents today for chronic disease management.  Interval History: Endorses compliance with levothyroxine and has no problems or adverse effects from this medication.  Last thyroid panel was normal in 04/2020. She denies any additional concerns today. Seen with the aid of a video Spanish interpreter.  Past Medical History:  Diagnosis Date   Hypothyroid 2010    No past surgical history on file.  Family History  Problem Relation Age of Onset   Hypertension Mother    Breast cancer Neg Hx     Allergies  Allergen Reactions   Penicillins     Outpatient Medications Prior to Visit  Medication Sig Dispense Refill   levothyroxine (SYNTHROID) 137 MCG tablet Take 1 tablet (137 mcg total) by mouth daily. 30 tablet 1   diclofenac Sodium (VOLTAREN) 1 % GEL Apply 2 g topically 4 (four) times daily. (Patient not taking: Reported on 12/17/2020) 100 g 0   ergocalciferol (DRISDOL) 1.25 MG (50000 UT) capsule Take 1 capsule (50,000 Units total) by mouth once a week. (Patient not taking: Reported on 12/17/2020) 12 capsule 1   naproxen (NAPROSYN) 500 MG tablet Take 1 tablet (500 mg total) by mouth 2 (two) times daily with a meal. (Patient not taking: No sig reported) 30 tablet 1   No facility-administered medications prior to visit.     ROS Review of Systems  Constitutional:  Negative for activity change, appetite change and fatigue.  HENT:  Negative for congestion, sinus pressure and sore throat.   Eyes:  Negative for visual disturbance.  Respiratory:  Negative for cough, chest tightness, shortness of breath and wheezing.   Cardiovascular:  Negative for chest pain and palpitations.  Gastrointestinal:  Negative for abdominal distention, abdominal pain and  constipation.  Endocrine: Negative for polydipsia.  Genitourinary:  Negative for dysuria and frequency.  Musculoskeletal:  Negative for arthralgias and back pain.  Skin:  Negative for rash.  Neurological:  Negative for tremors, light-headedness and numbness.  Hematological:  Does not bruise/bleed easily.  Psychiatric/Behavioral:  Negative for agitation and behavioral problems.    Objective:  BP 127/82   Pulse 76   Resp 16   Wt 168 lb 9.6 oz (76.5 kg)   SpO2 97%   BMI 30.84 kg/m   BP/Weight 12/17/2020 09/04/2020 02/06/2020  Systolic BP 127 136 137  Diastolic BP 82 88 83  Wt. (Lbs) 168.6 168.8 168.8  BMI 30.84 30.87 30.87      Physical Exam Constitutional:      Appearance: She is well-developed.  Cardiovascular:     Rate and Rhythm: Normal rate.     Heart sounds: Normal heart sounds. No murmur heard. Pulmonary:     Effort: Pulmonary effort is normal.     Breath sounds: Normal breath sounds. No wheezing or rales.  Chest:     Chest wall: No tenderness.  Abdominal:     General: Bowel sounds are normal. There is no distension.     Palpations: Abdomen is soft. There is no mass.     Tenderness: There is no abdominal tenderness.  Musculoskeletal:        General: Normal range of motion.     Right lower leg: No edema.     Left lower leg: No edema.  Neurological:  Mental Status: She is alert and oriented to person, place, and time.  Psychiatric:        Mood and Affect: Mood normal.    CMP Latest Ref Rng & Units 12/13/2019 05/22/2019 09/01/2018  Glucose 65 - 99 mg/dL 87 87 87  BUN 6 - 24 mg/dL 12 9 14   Creatinine 0.57 - 1.00 mg/dL 8.18 2.99  Sodium 134 - 144 mmol/L 137 138 140  Potassium 3.5 - 5.2 mmol/L 4.3 4.4 4.8  Chloride 96 - 106 mmol/L 101 100 103  CO2 20 - 29 mmol/L 25 23 22   Calcium 8.7 - 10.2 mg/dL 9.1 9.3 9.4  Total Protein 6.0 - 8.5 g/dL - - 6.8  Total Bilirubin 0.0 - 1.2 mg/dL - - 0.9  Alkaline Phos 39 - 117 IU/L - - 67  AST 0 - 40 IU/L - - 16  ALT 0 -  32 IU/L - - 14    Lipid Panel     Component Value Date/Time   CHOL 193 05/22/2019 1126   TRIG 146 05/22/2019 1126   HDL 45 05/22/2019 1126   CHOLHDL 4.3 05/22/2019 1126   CHOLHDL 4.4 12/24/2015 1154   VLDL 39 (H) 12/24/2015 1154   LDLCALC 122 (H) 05/22/2019 1126    CBC    Component Value Date/Time   WBC 6.1 12/12/2017 1148   WBC 5.5 06/16/2016 0939   RBC 4.63 12/12/2017 1148   RBC 4.62 06/16/2016 0939   HGB 14.2 12/12/2017 1148   HCT 41.1 12/12/2017 1148   PLT 336 12/12/2017 1148   MCV 89 12/12/2017 1148   MCH 30.7 12/12/2017 1148   MCH 31.2 06/16/2016 0939   MCHC 34.5 12/12/2017 1148   MCHC 34.0 06/16/2016 0939   RDW 12.3 12/12/2017 1148   LYMPHSABS 2.0 10/05/2017 1137   MONOABS 385 06/16/2016 0939   EOSABS 0.1 10/05/2017 1137   BASOSABS 0.0 10/05/2017 1137    Lab Results  Component Value Date   HGBA1C 5.3 12/13/2019    Assessment & Plan:  1. Hypothyroidism, unspecified type Controlled Will send off thyroid panel and refill medication accordingly. - Basic Metabolic Panel - T4, free - TSH   Health Care Maintenance: Declines flu shot No orders of the defined types were placed in this encounter.   Follow-up: Return in about 6 months (around 06/16/2021) for Hypothyroidism.       02/12/2020, MD, FAAFP. [facility] [place], [facility] [place]   12/17/2020, 6:00 PM

## 2020-12-18 ENCOUNTER — Other Ambulatory Visit: Payer: Self-pay | Admitting: Family Medicine

## 2020-12-18 DIAGNOSIS — E039 Hypothyroidism, unspecified: Secondary | ICD-10-CM

## 2020-12-18 LAB — BASIC METABOLIC PANEL
BUN/Creatinine Ratio: 22 (ref 9–23)
BUN: 14 mg/dL (ref 6–24)
CO2: 23 mmol/L (ref 20–29)
Calcium: 9.2 mg/dL (ref 8.7–10.2)
Chloride: 102 mmol/L (ref 96–106)
Creatinine, Ser: 0.63 mg/dL (ref 0.57–1.00)
Glucose: 98 mg/dL (ref 65–99)
Potassium: 4.3 mmol/L (ref 3.5–5.2)
Sodium: 138 mmol/L (ref 134–144)
eGFR: 112 mL/min/{1.73_m2} (ref >59)

## 2020-12-18 LAB — TSH: TSH: 0.235 u[IU]/mL — ABNORMAL LOW (ref 0.450–4.500)

## 2020-12-18 LAB — T4, FREE: Free T4: 1.88 ng/dL — ABNORMAL HIGH (ref 0.82–1.77)

## 2020-12-18 MED ORDER — LEVOTHYROXINE SODIUM 125 MCG PO TABS: 137.0000 ug | ORAL_TABLET | Freq: Every day | ORAL | 6 refills | Status: DC

## 2020-12-19 ENCOUNTER — Telehealth: Payer: Self-pay

## 2020-12-19 NOTE — Telephone Encounter (Signed)
-----   Message from Hoy Register, MD sent at 12/18/2020  8:11 AM EDT ----- Please inform her that her thyroid lab is slightly abnormal and I have had to decrease her levothyroxine from 137 to 125 mcg and a new prescription sent to her pharmacy.  We will repeat labs in 6 weeks.

## 2020-12-19 NOTE — Telephone Encounter (Signed)
Patient name and DOB has been verified Patient was informed of lab results. Patient had no questions.  

## 2021-02-02 ENCOUNTER — Ambulatory Visit: Payer: Self-pay | Attending: Family Medicine

## 2021-02-02 ENCOUNTER — Other Ambulatory Visit: Payer: Self-pay

## 2021-02-02 DIAGNOSIS — E039 Hypothyroidism, unspecified: Secondary | ICD-10-CM

## 2021-02-03 ENCOUNTER — Other Ambulatory Visit: Payer: Self-pay | Admitting: Family Medicine

## 2021-02-03 DIAGNOSIS — E039 Hypothyroidism, unspecified: Secondary | ICD-10-CM

## 2021-02-03 LAB — TSH: TSH: 1.54 u[IU]/mL (ref 0.450–4.500)

## 2021-02-03 LAB — T4, FREE: Free T4: 1.31 ng/dL (ref 0.82–1.77)

## 2021-02-03 MED ORDER — LEVOTHYROXINE SODIUM 125 MCG PO TABS: 137.0000 ug | ORAL_TABLET | Freq: Every day | ORAL | 6 refills | Status: DC

## 2021-02-05 ENCOUNTER — Telehealth: Payer: Self-pay

## 2021-02-05 NOTE — Telephone Encounter (Signed)
Patient name and DOB has been verified Patient was informed of lab results. Patient had no questions.  

## 2021-02-05 NOTE — Telephone Encounter (Signed)
-----   Message from Hoy Register, MD sent at 02/03/2021  9:27 AM EDT ----- Please inform her that labs are normal

## 2021-06-03 ENCOUNTER — Other Ambulatory Visit: Payer: Self-pay

## 2021-06-03 ENCOUNTER — Ambulatory Visit: Payer: Self-pay | Attending: Family Medicine

## 2021-06-09 ENCOUNTER — Ambulatory Visit: Payer: Self-pay | Attending: Family Medicine | Admitting: Family Medicine

## 2021-06-09 ENCOUNTER — Other Ambulatory Visit: Payer: Self-pay

## 2021-06-09 VITALS — BP 129/87 | HR 76 | Ht 60.0 in | Wt 168.8 lb

## 2021-06-09 DIAGNOSIS — N939 Abnormal uterine and vaginal bleeding, unspecified: Secondary | ICD-10-CM

## 2021-06-09 DIAGNOSIS — E039 Hypothyroidism, unspecified: Secondary | ICD-10-CM

## 2021-06-09 DIAGNOSIS — Z13228 Encounter for screening for other metabolic disorders: Secondary | ICD-10-CM

## 2021-06-09 MED ORDER — MEDROXYPROGESTERONE ACETATE 10 MG PO TABS: 10.0000 mg | ORAL_TABLET | Freq: Every day | ORAL | 0 refills | Status: DC

## 2021-06-09 NOTE — Patient Instructions (Signed)
Sangrado uterino anormal ?Abnormal Uterine Bleeding ?El sangrado uterino anormal sucede cuando tiene un sangrado anormal del ?tero. Puede tratarse de sangrado despu?s de [employer], o sangrado o manchas entre los per?odos [place]. Tambi?n puede tratarse de un sangrado m?s abundante de lo normal, per?odos menstruales que duran m?s de lo normal o sangrado que ocurre despu?s de la menopausia. ?El sangrado uterino anormal puede afectar a las adolescentes, las mujeres en edad f?rtil, las [place] y las mujeres que han llegado a la menopausia. Las causas m?s comunes de sangrado uterino anormal incluyen lo siguiente: ?Psychiatrist. ?Crecimientos anormales dentro del revestimiento del ?tero (p?lipos). ?Tumores o crecimientos benignos en el ?tero (fibromas). Estos no son tumores cancerosos. ?Infecci?n. ?C?ncer. ?Cantidad excesiva o insuficiente de algunas hormonas en el cuerpo (desequilibrios hormonales). ?El m?dico debe evaluar cualquier clase de sangrado anormal. Muchos casos son leves y simples de tratar, mientras que otros pueden ser m?s graves. El tratamiento depender? de la causa del sangrado y de su gravedad. ?Siga estas indicaciones en su casa: ?Medicamentos ?Use los medicamentos de venta libre y los recetados solamente como se lo haya indicado el m?dico. ?Consulte al m?dico si debe hacer o no lo siguiente: ?Tomar medicamentos como aspirina e ibuprofeno. Estos medicamentos pueden tener un efecto anticoagulante en la [place]. No tome estos medicamentos a menos que el m?dico se lo indique. ?Usar medicamentos de venta libre, vitaminas, hierbas y suplementos. ?Si le recetaron comprimidos de hierro, t?melos como se lo haya indicado el m?dico. Los comprimidos de hierro ayudan a Restaurant manager, fast food hierro que el organismo pierde a causa de esta afecci?n. ?Control del estre?imiento ?En los casos de sangrado intenso, posiblemente le indiquen que aumente la ingesta de hierro para tratar la anemia. Hacer esto puede  causarle estre?imiento. Para prevenir o tratar el estre?imiento, es posible que deba hacer lo siguiente: ?Beber suficiente l?quido como para mantener la orina de color amarillo p?lido. ?Usar medicamentos recetados o de Sales promotion account executive. ?Consumir alimentos ricos en fibra, como frijoles, cereales integrales, y frutas y verduras frescas. ?Limitar el consumo de alimentos ricos en grasa y az?cares procesados, como los alimentos fritos o dulces. ?Actividad ?Modifique su actividad para disminuir el sangrado si necesita cambiar la toalla higi?nica m?s de una vez cada 2 horas: ?Acu?stese en la cama con los pies levantados (elevados). ?Coloque una compresa fr?a en la zona inferior del abdomen. ?Descanse todo lo que pueda hasta que el sangrado se detenga o no sea tan intenso. ?Indicaciones generales ?No use tampones ni duchas vaginales, ni tenga relaciones sexuales hasta que el m?dico la autorice. ?Cambie las toallas higi?nicas con frecuencia. ?H?gase ex?menes regulares. Estos incluyen ex?menes p?lvicos y pruebas de detecci?n de c?ncer de cuello uterino. ?Es su responsabilidad [employer] de cualquier examen que se realice. Consulte al m?dico o pregunte en el departamento donde se realizan las pruebas cu?ndo estar?n [employer]. ?Controle su afecci?n para ver si hay cambios. Durante 2 meses, anote: ?Cu?ndo comienza su per?odo menstrual. ?Cu?ndo finaliza su per?odo menstrual. ?Cu?ndo se produce alguna hemorragia vaginal anormal. ?Qu? problemas observa. ?Concurra a todas las visitas de seguimiento. Esto es importante. ?Comun?quese con un m?dico si: ?Tiene sangrado que dura m?s de una semana. ?Se siente mareada por momentos. ?Siente n?useas o vomita. ?Se siente d?bil o siente que va a desvanecerse. ?Nota otros cambios que muestran que su afecci?n est? empeorando. ?Solicite ayuda de inmediato si: ?Se desmaya. ?Tiene sangrado que empapa una toalla higi?nica cada hora. ?Tiene dolor en el abdomen. ?Tiene fiebre o  escalofr?os. ?Se  siente d?bil o presenta sudoraci?n. ?Elimina co?gulos de sangre grandes por la vagina. ?Estos s?ntomas pueden representar un problema grave que constituye Radio broadcast assistant. No espere a ver si los s?ntomas desaparecen. Solicite atenci?n m?dica de inmediato. Comun?quese con el servicio de emergencias de su localidad (911 en los [place]). No conduzca por sus propios medios [employer]. ?Resumen ?El sangrado uterino anormal sucede cuando tiene un sangrado anormal del ?tero. ?El m?dico debe evaluar cualquier clase de sangrado anormal. Muchos casos son leves y simples de tratar, mientras que otros pueden ser m?s graves. ?El tratamiento depender? de la causa del sangrado y de su gravedad. ?Obtenga ayuda de inmediato si se desmaya, tiene sangrado que empapa una toalla higi?nica cada hora o elimina co?gulos de sangre grandes por la vagina. ?Esta informaci?n no tiene Theme park manager el consejo del m?dico. Aseg?rese de hacerle al m?dico cualquier pregunta que tenga. ?Document Revised: 08/13/2020 Document Reviewed: 08/13/2020 ?Elsevier Patient Education ? 2022 Elsevier Inc. ? ?

## 2021-06-09 NOTE — Progress Notes (Signed)
? ?Subjective:  ?Patient ID: Crystal Fleming, female    DOB: [birth year]  Age: 46 y.o. MRN: [id number] ? ?CC: Hypothyroidism ? ? ?HPI ?Crystal Fleming is a 46 y.o. year old female with a history of hypothyroidism who presents today for chronic disease management ? ?Interval History: ?Crystal Fleming complains of menorrhagia and increased frequency of her periods occurring every 2 weeks lasting 7-8 days each time and Crystal Fleming has abdominal cramps which are different from her usual dysmenorrhea.Ibuprofen provides some relief. Symptoms started 3-4 months ago with associated passage of clots. Prior to this her periods lasted 3-4 days and occurred every 28 days. Crystal Fleming is not on any contraceptive. ?Last PAP was NILM, in 02/2020. ?Denies presence of lightheadedness. ?Past Medical History:  ?Diagnosis Date  ? Hypothyroid 2010  ? ? ?No past surgical history on file. ? ?Family History  ?Problem Relation Age of Onset  ? Hypertension Mother   ? Breast cancer Neg Hx   ? ? ?Allergies  ?Allergen Reactions  ? Penicillins   ? ? ?Outpatient Medications Prior to Visit  ?Medication Sig Dispense Refill  ? levothyroxine (SYNTHROID) 125 MCG tablet Take 1 tablet (125 mcg total) by mouth daily. 30 tablet 6  ? diclofenac Sodium (VOLTAREN) 1 % GEL Apply 2 g topically 4 (four) times daily. (Patient not taking: Reported on 12/17/2020) 100 g 0  ? ergocalciferol (DRISDOL) 1.25 MG (50000 UT) capsule Take 1 capsule (50,000 Units total) by mouth once a week. (Patient not taking: Reported on 12/17/2020) 12 capsule 1  ? naproxen (NAPROSYN) 500 MG tablet Take 1 tablet (500 mg total) by mouth 2 (two) times daily with a meal. (Patient not taking: Reported on 05/22/2019) 30 tablet 1  ? ?No facility-administered medications prior to visit.  ? ? ? ?ROS ?Review of Systems  ?Constitutional:  Negative for activity change, appetite change and fatigue.  ?HENT:  Negative for congestion, sinus pressure and sore throat.   ?Eyes:  Negative for visual disturbance.   ?Respiratory:  Negative for cough, chest tightness, shortness of breath and wheezing.   ?Cardiovascular:  Negative for chest pain and palpitations.  ?Gastrointestinal:  Negative for abdominal distention, abdominal pain and constipation.  ?Endocrine: Negative for polydipsia.  ?Genitourinary:  Positive for menstrual problem. Negative for dysuria and frequency.  ?Musculoskeletal:  Negative for arthralgias and back pain.  ?Skin:  Negative for rash.  ?Neurological:  Negative for tremors, light-headedness and numbness.  ?Hematological:  Does not bruise/bleed easily.  ?Psychiatric/Behavioral:  Negative for agitation and behavioral problems.   ? ?Objective:  ?BP 129/87   Pulse 76   Ht 5' (1.524 m)   Wt 168 lb 12.8 oz (76.6 kg)   SpO2 100%   BMI 32.97 kg/m?  ? ?BP/Weight 06/09/2021 12/17/2020 09/04/2020  ?Systolic BP 381 829 937  ?Diastolic BP 87 82 88  ?Wt. (Lbs) 168.8 168.6 168.8  ?BMI 32.97 30.84 30.87  ? ? ? ? ?Physical Exam ?Constitutional:   ?   Appearance: Crystal Fleming is well-developed.  ?Cardiovascular:  ?   Rate and Rhythm: Normal rate.  ?   Heart sounds: Normal heart sounds. No murmur heard. ?Pulmonary:  ?   Effort: Pulmonary effort is normal.  ?   Breath sounds: Normal breath sounds. No wheezing or rales.  ?Chest:  ?   Chest wall: No tenderness.  ?Abdominal:  ?   General: Bowel sounds are normal. There is no distension.  ?   Palpations: Abdomen is soft. There is no mass.  ?   Tenderness: There  is no abdominal tenderness.  ?Musculoskeletal:     ?   General: Normal range of motion.  ?   Right lower leg: No edema.  ?   Left lower leg: No edema.  ?Neurological:  ?   Mental Status: Crystal Fleming is alert and oriented to person, place, and time.  ?Psychiatric:     ?   Mood and Affect: Mood normal.  ? ? ?CMP Latest Ref Rng & Units 12/17/2020 12/13/2019 05/22/2019  ?Glucose 65 - 99 mg/dL 98 87 87  ?BUN 6 - 24 mg/dL _0 ?Creatinine 0.57 - 1.00 mg/dL 0.63 0.69 0.65  ?Sodium 134 - 144 mmol/L 138 137 138  ?Potassium 3.5 - 5.2 mmol/L 4.3 4.3  4.4  ?Chloride 96 - 106 mmol/L 102 101 100  ?CO2 20 - 29 mmol/L _1 ?Calcium 8.7 - 10.2 mg/dL 9.2 9.1 9.3  ?Total Protein 6.0 - 8.5 g/dL - - -  ?Total Bilirubin 0.0 - 1.2 mg/dL - - -  ?Alkaline Phos 39 - 117 IU/L - - -  ?AST 0 - 40 IU/L - - -  ?ALT 0 - 32 IU/L - - -  ? ? ?Lipid Panel  ?   ?Component Value Date/Time  ? CHOL 193 05/22/2019 1126  ? TRIG 146 05/22/2019 1126  ? HDL 45 05/22/2019 1126  ? CHOLHDL 4.3 05/22/2019 1126  ? CHOLHDL 4.4 12/24/2015 1154  ? VLDL 39 (H) 12/24/2015 1154  ? LDLCALC 122 (H) 05/22/2019 1126  ? ? ?CBC ?   ?Component Value Date/Time  ? WBC 6.1 12/12/2017 1148  ? WBC 5.5 06/16/2016 0939  ? RBC 4.63 12/12/2017 1148  ? RBC 4.62 06/16/2016 0939  ? HGB 14.2 12/12/2017 1148  ? HCT 41.1 12/12/2017 1148  ? PLT 336 12/12/2017 1148  ? MCV 89 12/12/2017 1148  ? MCH 30.7 12/12/2017 1148  ? MCH 31.2 06/16/2016 0939  ? MCHC 34.5 12/12/2017 1148  ? MCHC 34.0 06/16/2016 0939  ? RDW 12.3 12/12/2017 1148  ? LYMPHSABS 2.0 10/05/2017 1137  ? MONOABS 385 06/16/2016 0939  ? EOSABS 0.1 10/05/2017 1137  ? BASOSABS 0.0 10/05/2017 1137  ? ? ?Lab Results  ?Component Value Date  ? HGBA1C 5.3 12/13/2019  ? ? ?Lab Results  ?Component Value Date  ? TSH 1.540 02/02/2021  ? ? ?Assessment & Plan:  ?1. Abnormal uterine bleeding ?We will need to exclude fibroids ?Pap smear was normal in 02/2020 ?- CBC with Differential/Platelet ?- US Pelvic Complete With Transvaginal; Future ?- medroxyPROGESTERone (PROVERA) 10 MG tablet; Take 1 tablet (10 mg total) by mouth daily.  Dispense: 10 tablet; Refill: 0 ?- FSH/LH ?- Estradiol ? ?2. Hypothyroidism, unspecified type ?We will check thyroid panel as well and adjust regimen accordingly ?- T4, free ?- TSH ? ?3. Screening for metabolic disorder ?- LP+Non-HDL Cholesterol ?- CMP14+EGFR ?- Hemoglobin A1c ? ? ?Health Care Maintenance: Next mammogram due in 09/2021 ?Meds ordered this encounter  ?Medications  ? medroxyPROGESTERone (PROVERA) 10 MG tablet  ?  Sig: Take 1 tablet (10 mg  total) by mouth daily.  ?  Dispense:  10 tablet  ?  Refill:  0  ? ? ?Follow-up: Return in about 1 month (around 07/10/2021) for abnormal uterine bleed.  ? ? ? ? ? ?Charlott Rakes, MD, FAAFP. ?[place] ?[place], [place] ?[phone]   ?06/09/2021, 9:08 AM ?

## 2021-06-09 NOTE — Progress Notes (Signed)
Heavy vaginal bleeding for 3 months. ?

## 2021-06-10 LAB — LP+NON-HDL CHOLESTEROL
Cholesterol, Total: 215 mg/dL — ABNORMAL HIGH (ref 100–199)
HDL: 49 mg/dL (ref >39)
LDL Chol Calc (NIH): 146 mg/dL — ABNORMAL HIGH (ref 0–99)
Total Non-HDL-Chol (LDL+VLDL): 166 mg/dL — ABNORMAL HIGH (ref 0–129)
Triglycerides: 112 mg/dL (ref 0–149)
VLDL Cholesterol Cal: 20 mg/dL (ref 5–40)

## 2021-06-10 LAB — CBC WITH DIFFERENTIAL/PLATELET
Basophils Absolute: 0 10*3/uL (ref 0.0–0.2)
Basos: 1 %
EOS (ABSOLUTE): 0 10*3/uL (ref 0.0–0.4)
Eos: 1 %
Hematocrit: 40.8 % (ref 34.0–46.6)
Hemoglobin: 14 g/dL (ref 11.1–15.9)
Immature Grans (Abs): 0 10*3/uL (ref 0.0–0.1)
Immature Granulocytes: 0 %
Lymphocytes Absolute: 1.8 10*3/uL (ref 0.7–3.1)
Lymphs: 40 %
MCH: 30.8 pg (ref 26.6–33.0)
MCHC: 34.3 g/dL (ref 31.5–35.7)
MCV: 90 fL (ref 79–97)
Monocytes Absolute: 0.4 10*3/uL (ref 0.1–0.9)
Monocytes: 9 %
Neutrophils Absolute: 2.2 10*3/uL (ref 1.4–7.0)
Neutrophils: 49 %
Platelets: 334 10*3/uL (ref 150–450)
RBC: 4.55 x10E6/uL (ref 3.77–5.28)
RDW: 12.7 % (ref 11.7–15.4)
WBC: 4.5 10*3/uL (ref 3.4–10.8)

## 2021-06-10 LAB — FSH/LH
FSH: 18.1 m[IU]/mL
LH: 5.1 m[IU]/mL

## 2021-06-10 LAB — CMP14+EGFR
ALT: 11 IU/L (ref 0–32)
AST: 12 IU/L (ref 0–40)
Albumin/Globulin Ratio: 1.8 (ref 1.2–2.2)
Albumin: 4.5 g/dL (ref 3.8–4.8)
Alkaline Phosphatase: 59 IU/L (ref 44–121)
BUN/Creatinine Ratio: 21 (ref 9–23)
BUN: 15 mg/dL (ref 6–24)
Bilirubin Total: 1 mg/dL (ref 0.0–1.2)
CO2: 23 mmol/L (ref 20–29)
Calcium: 8.9 mg/dL (ref 8.7–10.2)
Chloride: 103 mmol/L (ref 96–106)
Creatinine, Ser: 0.72 mg/dL (ref 0.57–1.00)
Globulin, Total: 2.5 g/dL (ref 1.5–4.5)
Glucose: 96 mg/dL (ref 70–99)
Potassium: 4.2 mmol/L (ref 3.5–5.2)
Sodium: 139 mmol/L (ref 134–144)
Total Protein: 7 g/dL (ref 6.0–8.5)
eGFR: 106 mL/min/{1.73_m2} (ref >59)

## 2021-06-10 LAB — ESTRADIOL: Estradiol: 14.4 pg/mL

## 2021-06-10 LAB — HEMOGLOBIN A1C
Est. average glucose Bld gHb Est-mCnc: 105 mg/dL
Hgb A1c MFr Bld: 5.3 % (ref 4.8–5.6)

## 2021-06-10 LAB — T4, FREE: Free T4: 1.59 ng/dL (ref 0.82–1.77)

## 2021-06-10 LAB — TSH: TSH: 2.14 u[IU]/mL (ref 0.450–4.500)

## 2021-06-15 ENCOUNTER — Ambulatory Visit (HOSPITAL_COMMUNITY)
Admission: RE | Admit: 2021-06-15 | Discharge: 2021-06-15 | Disposition: A | Discharge status: Home / Self Care | Payer: No Typology Code available for payment source | Source: Ambulatory Visit | Attending: Family Medicine | Admitting: Family Medicine

## 2021-06-15 ENCOUNTER — Other Ambulatory Visit: Payer: Self-pay

## 2021-06-15 DIAGNOSIS — N939 Abnormal uterine and vaginal bleeding, unspecified: Secondary | ICD-10-CM | POA: Insufficient documentation

## 2021-10-12 ENCOUNTER — Other Ambulatory Visit: Payer: Self-pay

## 2021-10-12 DIAGNOSIS — Z1231 Encounter for screening mammogram for malignant neoplasm of breast: Secondary | ICD-10-CM

## 2021-11-11 ENCOUNTER — Encounter: Payer: Self-pay | Admitting: Family Medicine

## 2021-11-11 ENCOUNTER — Ambulatory Visit: Payer: Self-pay | Attending: Family Medicine | Admitting: Family Medicine

## 2021-11-11 VITALS — BP 139/89 | HR 78 | Temp 98.5°F | Ht 60.0 in | Wt 166.8 lb

## 2021-11-11 DIAGNOSIS — E039 Hypothyroidism, unspecified: Secondary | ICD-10-CM

## 2021-11-11 DIAGNOSIS — N939 Abnormal uterine and vaginal bleeding, unspecified: Secondary | ICD-10-CM

## 2021-11-11 DIAGNOSIS — Z1211 Encounter for screening for malignant neoplasm of colon: Secondary | ICD-10-CM

## 2021-11-11 MED ORDER — DROSPIRENONE-ETHINYL ESTRADIOL 3-0.03 MG PO TABS: 1.0000 | ORAL_TABLET | Freq: Every day | ORAL | 11 refills | Status: DC

## 2021-11-11 MED ORDER — LEVOTHYROXINE SODIUM 125 MCG PO TABS: 137.0000 ug | ORAL_TABLET | Freq: Every day | ORAL | 6 refills | Status: DC

## 2021-11-11 NOTE — Patient Instructions (Signed)
Sangrado uterino anormal Abnormal Uterine Bleeding El sangrado uterino anormal es el sangrado del tero ms duradero o ms abundante de lo normal. Esto puede incluir lo siguiente: Sangrado luego de mantener relaciones sexuales. Sangrado entre los perodos mensuales (menstruales). Sangrado ms abundante de lo normal. Perodos mensuales que duran ms de lo normal. Sangrado despus de haber dejado de tener el perodo mensual (menopausia). Si tiene un sangrado que no considera normal, debe consultar al mdico. El tratamiento depende de la causa del sangrado y de cunto sangre. Siga estas indicaciones en su casa: Medicamentos Use los medicamentos de venta libre y los recetados solamente como se lo haya indicado el mdico. Consulte al mdico si debe hacer o no lo siguiente: Tomar medicamentos como aspirina e ibuprofeno. No tome estos medicamentos a menos que el mdico se lo indique. Usar medicamentos de venta libre, vitaminas, hierbas y suplementos. Es posible que le den comprimidos de hierro. Tmelos como se lo haya indicado el mdico. Control del estreimiento Si toma comprimidos de hierro, es posible que deba tomar estas medidas para prevenir o tratar los problemas para defecar (estreimiento): Beber suficiente lquido para mantener el pis (la orina) de color amarillo plido. Usar medicamentos recetados o de venta libre. [place] alimentos ricos en fibra. Entre ellos, frijoles, cereales integrales y frutas y verduras frescas. Limitar los alimentos con alto contenido de grasa y azcar. Estos incluyen alimentos fritos o dulces. Actividad Haga cambios en su actividad para reducir el sangrado si necesita cambiar la toalla higinica ms de una vez cada 2 horas: Acustese en la cama con los pies levantados (elevados). Coloque una compresa fra en la zona inferior del vientre. Descanse todo lo que pueda hasta que el sangrado se detenga o no sea tan intenso. Indicaciones generales No use tampones, no  se haga duchas vaginales ni tenga relaciones sexuales hasta que el mdico le diga que puede hacerlo. Cmbiese las toallas con frecuencia. Hgase exmenes regulares. Estos incluyen: Exmenes plvicos. Exmenes para detectar el cncer de cuello uterino. Es su responsabilidad obtener los resultados de cualquier examen que se realice. Pregunte cmo obtener sus resultados cuando estn listos. Fjese si hay algn cambio en el sangrado. Durante 2 meses, anote: Cundo comienza su perodo mensual. Cundo finaliza su perodo mensual. Cundo tiene alguna hemorragia anormal por la vagina. Qu problemas observa. Concurra a todas las visitas de seguimiento. Comunquese con un mdico si: El sangrado dura ms de una semana. Se siente mareada por momentos. Tiene ganas de vomitar (nuseas). Vomita. Se siente dbil o siente que va a desvanecerse. Sus sntomas empeoran. Solicite ayuda de inmediato si: Se desmaya. Debe cambiarse los apsitos cada hora. Tiene dolor de vientre. Tiene fiebre o escalofros. Se siente sudorosa o dbil. Elimina cogulos de sangre grandes por la vagina. Estos sntomas pueden indicar una emergencia. Solicite ayuda de inmediato. Comunquese con el servicio de emergencias de su localidad (911 en los [place]). No espere a ver si los sntomas desaparecen. No conduzca por sus propios medios hasta el hospital. Resumen El sangrado uterino anormal es el sangrado del tero ms duradero o ms abundante de lo normal. Cualquier tipo de sangrado que no sea normal debe consultarse con el mdico. El tratamiento depende de la causa del sangrado y de cunto sangre. Obtenga ayuda de inmediato si se desmaya, tiene que cambiarse la toalla cada hora o elimina cogulos de sangre grandes por la vagina. Esta informacin no tiene como fin reemplazar el consejo del mdico. Asegrese de hacerle al mdico cualquier pregunta que tenga. Document   Revised: 08/13/2020 Document Reviewed:  08/13/2020 Elsevier Patient Education  2023 Elsevier Inc.  

## 2021-11-11 NOTE — Progress Notes (Signed)
Having pain in lower right side of abdomen. States that bleeding has become lighter with taking Provera medication.

## 2021-11-11 NOTE — Progress Notes (Signed)
Subjective:  Patient ID: Crystal Fleming, female    DOB: Feb 04, 1976  Age: 46 y.o. MRN: [id number]  CC: Abdominal Pain   HPI Crystal Fleming Crystal Fleming is a 46 y.o. year old female with a history of hypothyroidism who presents today for chronic disease management  Interval History: The Provera prescribed at her last visit for abnormal uterine bleed did bring about improvement in menorrhagia but then symptoms returned when she completed the 10 day course. Pelvic ultrasound had revealed: IMPRESSION: No pelvic sonographic abnormalities.   Specifically, normal appearing endometrial complex without evidence of uterine leiomyomata.   If bleeding remains unresponsive to hormonal or medical therapy, sonohysterogram should be considered for focal lesion work-up. (Ref: Radiological Reasoning: Algorithmic Workup of Abnormal Vaginal Bleeding with Endovaginal Sonography and Sonohysterography. AJR 2008; [id number]:D40-81)     She is doing well on levothyroxine for her hypothyroidism and is needing refill. Past Medical History:  Diagnosis Date   Hypothyroid 2010    History reviewed. No pertinent surgical history.  Family History  Problem Relation Age of Onset   Hypertension Mother    Breast cancer Neg Hx     Social History   Socioeconomic History   Marital status: Married    Spouse name: Not on file   Number of children: 3   Years of education: Not on file   Highest education level: 3rd grade  Occupational History   Not on file  Tobacco Use   Smoking status: Never   Smokeless tobacco: Never  Vaping Use   Vaping Use: Never used  Substance and Sexual Activity   Alcohol use: No   Drug use: No   Sexual activity: Not Currently    Birth control/protection: None  Other Topics Concern   Not on file  Social History Narrative   Not on file   Social Determinants of Health   Financial Resource Strain: Not on file  Food Insecurity: Not on file  Transportation Needs: No  Transportation Needs (09/04/2020)   PRAPARE - Transportation    Lack of Transportation (Medical): No    Lack of Transportation (Non-Medical): No  Physical Activity: Not on file  Stress: Not on file  Social Connections: Not on file    Allergies  Allergen Reactions   Penicillins     Outpatient Medications Prior to Visit  Medication Sig Dispense Refill   levothyroxine (SYNTHROID) 125 MCG tablet Take 1 tablet (125 mcg total) by mouth daily. 30 tablet 6   medroxyPROGESTERone (PROVERA) 10 MG tablet Take 1 tablet (10 mg total) by mouth daily. 10 tablet 0   No facility-administered medications prior to visit.     ROS Review of Systems  Constitutional:  Negative for activity change, appetite change and fatigue.  HENT:  Negative for congestion, sinus pressure and sore throat.   Eyes:  Negative for visual disturbance.  Respiratory:  Negative for cough, chest tightness, shortness of breath and wheezing.   Cardiovascular:  Negative for chest pain and palpitations.  Gastrointestinal:  Negative for abdominal distention, abdominal pain and constipation.  Endocrine: Negative for polydipsia.  Genitourinary:  Positive for menstrual problem. Negative for dysuria and frequency.  Musculoskeletal:  Negative for arthralgias and back pain.  Skin:  Negative for rash.  Neurological:  Negative for tremors, light-headedness and numbness.  Hematological:  Does not bruise/bleed easily.  Psychiatric/Behavioral:  Negative for agitation and behavioral problems.     Objective:  BP 139/89   Pulse 78   Temp 98.5 F (36.9 C) (Oral)   Ht  5' (1.524 m)   Wt 166 lb 12.8 oz (75.7 kg)   SpO2 99%   BMI 32.58 kg/m      11/11/2021   11:23 AM 11/11/2021   10:44 AM 06/09/2021    8:41 AM  BP/Weight  Systolic BP 139 155 129  Diastolic BP 89 97 87  Wt. (Lbs)  166.8 168.8  BMI  32.58 kg/m2 32.97 kg/m2      Physical Exam Constitutional:      Appearance: She is well-developed.  Cardiovascular:     Rate and  Rhythm: Normal rate.     Heart sounds: Normal heart sounds. No murmur heard. Pulmonary:     Effort: Pulmonary effort is normal.     Breath sounds: Normal breath sounds. No wheezing or rales.  Chest:     Chest wall: No tenderness.  Abdominal:     General: Bowel sounds are normal. There is no distension.     Palpations: Abdomen is soft. There is no mass.     Tenderness: There is no abdominal tenderness.  Musculoskeletal:        General: Normal range of motion.     Right lower leg: No edema.     Left lower leg: No edema.  Neurological:     Mental Status: She is alert and oriented to person, place, and time.  Psychiatric:        Mood and Affect: Mood normal.        Latest Ref Rng & Units 06/09/2021    9:15 AM 12/17/2020    4:10 PM 12/13/2019   11:20 AM  CMP  Glucose 70 - 99 mg/dL 96  98  87   BUN 6 - 24 mg/dL 15  14  12    Creatinine 0.57 - 1.00 mg/dL  2.77  4.12   Sodium 134 - 144 mmol/L 139  138  137   Potassium 3.5 - 5.2 mmol/L 4.2  4.3  4.3   Chloride 96 - 106 mmol/L 103  102  101   CO2 20 - 29 mmol/L 23  23  25    Calcium 8.7 - 10.2 mg/dL 8.9  9.2  9.1   Total Protein 6.0 - 8.5 g/dL 7.0     Total Bilirubin 0.0 - 1.2 mg/dL 1.0     Alkaline Phos 44 - 121 IU/L 59     AST 0 - 40 IU/L 12     ALT 0 - 32 IU/L 11       Lipid Panel     Component Value Date/Time   CHOL 215 (H) 06/09/2021 0915   TRIG 112 06/09/2021 0915   HDL 49 06/09/2021 0915   CHOLHDL 4.3 05/22/2019 1126   CHOLHDL 4.4 12/24/2015 1154   VLDL 39 (H) 12/24/2015 1154   LDLCALC 146 (H) 06/09/2021 0915    CBC    Component Value Date/Time   WBC 4.5 06/09/2021 0915   WBC 5.5 06/16/2016 0939   RBC 4.55 06/09/2021 0915   RBC 4.62 06/16/2016 0939   HGB 14.0 06/09/2021 0915   HCT 40.8 06/09/2021 0915   PLT 334 06/09/2021 0915   MCV 90 06/09/2021 0915   MCH 30.8 06/09/2021 0915   MCH 31.2 06/16/2016 0939   MCHC 34.3 06/09/2021 0915   MCHC 34.0 06/16/2016 0939   RDW 12.7 06/09/2021 0915   LYMPHSABS 1.8  06/09/2021 0915   MONOABS 385 06/16/2016 0939   EOSABS 0.0 06/09/2021 0915   BASOSABS 0.0 06/09/2021 0915    Lab Results  Component Value Date   HGBA1C 5.3 06/09/2021    Lab Results  Component Value Date   TSH 2.140 06/09/2021    Assessment & Plan:  1. Abnormal uterine bleeding Pelvic ultrasound nonrevealing Pap smear normal from 02/2020 She has completed course of Provera Will place on OCP - drospirenone-ethinyl estradiol (YASMIN 28) 3-0.03 MG tablet; Take 1 tablet by mouth daily.  Dispense: 28 tablet; Refill: 11  2. Hypothyroidism, unspecified type Controlled - levothyroxine (SYNTHROID) 125 MCG tablet; Take 1 tablet (125 mcg total) by mouth daily.  Dispense: 30 tablet; Refill: 6  3. Screening for colon cancer - Fecal occult blood, imunochemical    Meds ordered this encounter  Medications   drospirenone-ethinyl estradiol (YASMIN 28) 3-0.03 MG tablet    Sig: Take 1 tablet by mouth daily.    Dispense:  28 tablet    Refill:  11   levothyroxine (SYNTHROID) 125 MCG tablet    Sig: Take 1 tablet (125 mcg total) by mouth daily.    Dispense:  30 tablet    Refill:  6    Dose change    Follow-up: Return in about 6 months (around 05/14/2022) for Chronic medical conditions.       Hoy Register, MD, FAAFP. [facility] [place], [name] [phone]   11/11/2021, 12:29 PM

## 2021-11-26 ENCOUNTER — Ambulatory Visit
Admission: RE | Admit: 2021-11-26 | Discharge: 2021-11-26 | Disposition: A | Discharge status: Home / Self Care | Payer: No Typology Code available for payment source | Source: Ambulatory Visit | Attending: Obstetrics and Gynecology | Admitting: Obstetrics and Gynecology

## 2021-11-26 ENCOUNTER — Ambulatory Visit: Payer: No Typology Code available for payment source

## 2021-11-26 ENCOUNTER — Other Ambulatory Visit: Payer: Self-pay | Admitting: *Deleted

## 2021-11-26 DIAGNOSIS — Z1231 Encounter for screening mammogram for malignant neoplasm of breast: Secondary | ICD-10-CM

## 2022-04-21 ENCOUNTER — Ambulatory Visit: Payer: Self-pay | Attending: Family Medicine

## 2022-05-19 ENCOUNTER — Ambulatory Visit: Payer: Self-pay | Attending: Family Medicine | Admitting: Family Medicine

## 2022-05-19 ENCOUNTER — Other Ambulatory Visit (HOSPITAL_COMMUNITY)
Admission: RE | Admit: 2022-05-19 | Discharge: 2022-05-19 | Disposition: A | Discharge status: Home / Self Care | Payer: Self-pay | Source: Ambulatory Visit | Attending: Family Medicine | Admitting: Family Medicine

## 2022-05-19 ENCOUNTER — Encounter: Payer: Self-pay | Admitting: Family Medicine

## 2022-05-19 VITALS — BP 131/84 | HR 74 | Temp 98.0°F | Ht 62.0 in | Wt 172.2 lb

## 2022-05-19 DIAGNOSIS — R102 Pelvic and perineal pain: Secondary | ICD-10-CM

## 2022-05-19 DIAGNOSIS — R399 Unspecified symptoms and signs involving the genitourinary system: Secondary | ICD-10-CM

## 2022-05-19 DIAGNOSIS — Z13228 Encounter for screening for other metabolic disorders: Secondary | ICD-10-CM

## 2022-05-19 DIAGNOSIS — E039 Hypothyroidism, unspecified: Secondary | ICD-10-CM

## 2022-05-19 DIAGNOSIS — N898 Other specified noninflammatory disorders of vagina: Secondary | ICD-10-CM

## 2022-05-19 DIAGNOSIS — Z1211 Encounter for screening for malignant neoplasm of colon: Secondary | ICD-10-CM

## 2022-05-19 LAB — POCT URINALYSIS DIP (CLINITEK)
Bilirubin, UA: NEGATIVE
Blood, UA: NEGATIVE
Glucose, UA: NEGATIVE mg/dL
Ketones, POC UA: NEGATIVE mg/dL
Leukocytes, UA: NEGATIVE
Nitrite, UA: NEGATIVE
Spec Grav, UA: 1.025 (ref 1.010–1.025)
Urobilinogen, UA: 0.2 E.U./dL
pH, UA: 6 (ref 5.0–8.0)

## 2022-05-19 MED ORDER — NORGESTIMATE-ETH ESTRADIOL 0.25-35 MG-MCG PO TABS: 1.0000 | ORAL_TABLET | Freq: Every day | ORAL | 11 refills | Status: DC

## 2022-05-19 NOTE — Progress Notes (Signed)
Having lower abdomen pain.

## 2022-05-19 NOTE — Progress Notes (Signed)
Subjective:  Patient ID: Brown Human, female    DOB: 09-06-1975  Age: 47 y.o. MRN: [id number]  CC: Hypothyroidism   HPI Crystal Fleming is a 47 y.o. year old female with a history of hypothyroidism who presents today for chronic disease management   Interval History:  She has had RLQ and LLQ pain for the last 3 days described as 'strong' associated with vaginal discharge but she has had no vaginal itching. She has had urinary frequency and dysuria but no hematuria. She has not changed sexual partners. She has left flank pain, but no nausea, vomiting.  She was placed on Yasmin for treatment of abnormal uterine bleed however she was unable to obtain it due to cost.  She is requesting a substitute. Endorses adherence with levothyroxine for her hypothyroidism. Past Medical History:  Diagnosis Date   Hypothyroid 2010    No past surgical history on file.  Family History  Problem Relation Age of Onset   Hypertension Mother    Breast cancer Neg Hx     Social History   Socioeconomic History   Marital status: Married    Spouse name: Not on file   Number of children: 3   Years of education: Not on file   Highest education level: 3rd grade  Occupational History   Not on file  Tobacco Use   Smoking status: Never   Smokeless tobacco: Never  Vaping Use   Vaping Use: Never used  Substance and Sexual Activity   Alcohol use: No   Drug use: No   Sexual activity: Not Currently    Birth control/protection: None  Other Topics Concern   Not on file  Social History Narrative   Not on file   Social Determinants of Health   Financial Resource Strain: Not on file  Food Insecurity: Not on file  Transportation Needs: No Transportation Needs (09/04/2020)   PRAPARE - Transportation    Lack of Transportation (Medical): No    Lack of Transportation (Non-Medical): No  Physical Activity: Not on file  Stress: Not on file  Social Connections: Not on file     Allergies  Allergen Reactions   Penicillins     Outpatient Medications Prior to Visit  Medication Sig Dispense Refill   levothyroxine (SYNTHROID) 125 MCG tablet Take 1 tablet (125 mcg total) by mouth daily. 30 tablet 6   drospirenone-ethinyl estradiol (YASMIN 28) 3-0.03 MG tablet Take 1 tablet by mouth daily. (Patient not taking: Reported on 05/19/2022) 28 tablet 11   No facility-administered medications prior to visit.     ROS Review of Systems  Constitutional:  Negative for activity change and appetite change.  HENT:  Negative for sinus pressure and sore throat.   Respiratory:  Negative for chest tightness, shortness of breath and wheezing.   Cardiovascular:  Negative for chest pain and palpitations.  Gastrointestinal:  Negative for abdominal distention, abdominal pain and constipation.  Genitourinary:  Positive for flank pain and vaginal discharge.  Psychiatric/Behavioral:  Negative for behavioral problems and dysphoric mood.     Objective:  BP 131/84   Pulse 74   Temp 98 F (36.7 C) (Oral)   Ht 5' 2"$  (1.575 m)   Wt 172 lb 3.2 oz (78.1 kg)   LMP 04/14/2022   SpO2 99%   BMI 31.50 kg/m      05/19/2022    8:59 AM 11/11/2021   11:23 AM 11/11/2021   10:44 AM  BP/Weight  Systolic BP [id number]  Diastolic  BP 84 89 97  Wt. (Lbs) 172.2  166.8  BMI 31.5 kg/m2  32.58 kg/m2      Physical Exam Constitutional:      Appearance: She is well-developed.  Cardiovascular:     Rate and Rhythm: Normal rate.     Heart sounds: Normal heart sounds. No murmur heard. Pulmonary:     Effort: Pulmonary effort is normal.     Breath sounds: Normal breath sounds. No wheezing or rales.  Chest:     Chest wall: No tenderness.  Abdominal:     General: Bowel sounds are normal. There is no distension.     Palpations: Abdomen is soft. There is no mass.     Tenderness: There is no abdominal tenderness.  Musculoskeletal:        General: Normal range of motion.     Right lower leg: No  edema.     Left lower leg: No edema.  Neurological:     Mental Status: She is alert and oriented to person, place, and time.  Psychiatric:        Mood and Affect: Mood normal.        Latest Ref Rng & Units 06/09/2021    9:15 AM 12/17/2020    4:10 PM 12/13/2019   11:20 AM  CMP  Glucose 70 - 99 mg/dL 96  98  87   BUN 6 - 24 mg/dL 15  14  12   $ Creatinine 0.57 - 1.00 mg/dL 0.72  0.63  0.69   Sodium 134 - 144 mmol/L 139  138  137   Potassium 3.5 - 5.2 mmol/L 4.2  4.3  4.3   Chloride 96 - 106 mmol/L 103  102  101   CO2 20 - 29 mmol/L 23  23  25   $ Calcium 8.7 - 10.2 mg/dL 8.9  9.2  9.1   Total Protein 6.0 - 8.5 g/dL 7.0     Total Bilirubin 0.0 - 1.2 mg/dL 1.0     Alkaline Phos 44 - 121 IU/L 59     AST 0 - 40 IU/L 12     ALT 0 - 32 IU/L 11       Lipid Panel     Component Value Date/Time   CHOL 215 (H) 06/09/2021 0915   TRIG 112 06/09/2021 0915   HDL 49 06/09/2021 0915   CHOLHDL 4.3 05/22/2019 1126   CHOLHDL 4.4 12/24/2015 1154   VLDL 39 (H) 12/24/2015 1154   LDLCALC 146 (H) 06/09/2021 0915    CBC    Component Value Date/Time   WBC 4.5 06/09/2021 0915   WBC 5.5 06/16/2016 0939   RBC 4.55 06/09/2021 0915   RBC 4.62 06/16/2016 0939   HGB 14.0 06/09/2021 0915   HCT 40.8 06/09/2021 0915   PLT 334 06/09/2021 0915   MCV 90 06/09/2021 0915   MCH 30.8 06/09/2021 0915   MCH 31.2 06/16/2016 0939   MCHC 34.3 06/09/2021 0915   MCHC 34.0 06/16/2016 0939   RDW 12.7 06/09/2021 0915   LYMPHSABS 1.8 06/09/2021 0915   MONOABS 385 06/16/2016 0939   EOSABS 0.0 06/09/2021 0915   BASOSABS 0.0 06/09/2021 0915    Lab Results  Component Value Date   HGBA1C 5.3 06/09/2021    Lab Results  Component Value Date   TSH 2.140 06/09/2021    Assessment & Plan:  1. Urinary symptom or sign UA negative for UTI - POCT URINALYSIS DIP (CLINITEK)  2. Pelvic pain - Cervicovaginal ancillary only  3.  Screening for metabolic disorder - LP+Non-HDL Cholesterol - CMP14+EGFR - CBC with  Differential/Platelet  4. Hypothyroidism, unspecified type Controlled Will send of thyroid labs and adjust levothyroxine dose accordingly - T4, free - TSH - T3  5. Vaginal discharge - Cervicovaginal ancillary only  6. Screening for colon cancer - Fecal occult blood, imunochemical([facility])   Meds ordered this encounter  Medications   norgestimate-ethinyl estradiol (SPRINTEC 28) 0.25-35 MG-MCG tablet    Sig: Take 1 tablet by mouth daily.    Dispense:  28 tablet    Refill:  11          Charlott Rakes, MD, FAAFP. [facility] and [place], [place]   05/19/2022, 1:04 PM

## 2022-05-20 LAB — LP+NON-HDL CHOLESTEROL
Cholesterol, Total: 227 mg/dL — ABNORMAL HIGH (ref 100–199)
HDL: 53 mg/dL (ref >39)
LDL Chol Calc (NIH): 153 mg/dL — ABNORMAL HIGH (ref 0–99)
Total Non-HDL-Chol (LDL+VLDL): 174 mg/dL — ABNORMAL HIGH (ref 0–129)
Triglycerides: 120 mg/dL (ref 0–149)
VLDL Cholesterol Cal: 21 mg/dL (ref 5–40)

## 2022-05-20 LAB — CMP14+EGFR
ALT: 11 IU/L (ref 0–32)
AST: 11 IU/L (ref 0–40)
Albumin/Globulin Ratio: 1.7 (ref 1.2–2.2)
Albumin: 4.4 g/dL (ref 3.9–4.9)
Alkaline Phosphatase: 62 IU/L (ref 44–121)
BUN/Creatinine Ratio: 18 (ref 9–23)
BUN: 12 mg/dL (ref 6–24)
Bilirubin Total: 0.7 mg/dL (ref 0.0–1.2)
CO2: 23 mmol/L (ref 20–29)
Calcium: 9 mg/dL (ref 8.7–10.2)
Chloride: 99 mmol/L (ref 96–106)
Creatinine, Ser: 0.68 mg/dL (ref 0.57–1.00)
Globulin, Total: 2.6 g/dL (ref 1.5–4.5)
Glucose: 95 mg/dL (ref 70–99)
Potassium: 4.6 mmol/L (ref 3.5–5.2)
Sodium: 135 mmol/L (ref 134–144)
Total Protein: 7 g/dL (ref 6.0–8.5)
eGFR: 109 mL/min/{1.73_m2} (ref >59)

## 2022-05-20 LAB — CBC WITH DIFFERENTIAL/PLATELET
Basophils Absolute: 0 10*3/uL (ref 0.0–0.2)
Basos: 1 %
EOS (ABSOLUTE): 0 10*3/uL (ref 0.0–0.4)
Eos: 1 %
Hematocrit: 37.2 % (ref 34.0–46.6)
Hemoglobin: 12.2 g/dL (ref 11.1–15.9)
Immature Grans (Abs): 0 10*3/uL (ref 0.0–0.1)
Immature Granulocytes: 0 %
Lymphocytes Absolute: 1.9 10*3/uL (ref 0.7–3.1)
Lymphs: 34 %
MCH: 27.5 pg (ref 26.6–33.0)
MCHC: 32.8 g/dL (ref 31.5–35.7)
MCV: 84 fL (ref 79–97)
Monocytes Absolute: 0.4 10*3/uL (ref 0.1–0.9)
Monocytes: 7 %
Neutrophils Absolute: 3.2 10*3/uL (ref 1.4–7.0)
Neutrophils: 57 %
Platelets: 292 10*3/uL (ref 150–450)
RBC: 4.44 x10E6/uL (ref 3.77–5.28)
RDW: 13.9 % (ref 11.7–15.4)
WBC: 5.7 10*3/uL (ref 3.4–10.8)

## 2022-05-20 LAB — CERVICOVAGINAL ANCILLARY ONLY
Bacterial Vaginitis (gardnerella): NEGATIVE
Candida Glabrata: NEGATIVE
Candida Vaginitis: NEGATIVE
Chlamydia: NEGATIVE
Comment: NEGATIVE
Comment: NEGATIVE
Comment: NEGATIVE
Comment: NEGATIVE
Comment: NEGATIVE
Comment: NORMAL
Neisseria Gonorrhea: NEGATIVE
Trichomonas: NEGATIVE

## 2022-05-20 LAB — T3: T3, Total: 91 ng/dL (ref 71–180)

## 2022-05-20 LAB — T4, FREE: Free T4: 1.22 ng/dL (ref 0.82–1.77)

## 2022-05-20 LAB — TSH: TSH: 7.26 u[IU]/mL — ABNORMAL HIGH (ref 0.450–4.500)

## 2022-05-21 ENCOUNTER — Other Ambulatory Visit: Payer: Self-pay | Admitting: Family Medicine

## 2022-05-21 DIAGNOSIS — E039 Hypothyroidism, unspecified: Secondary | ICD-10-CM

## 2022-05-21 MED ORDER — LEVOTHYROXINE SODIUM 137 MCG PO TABS: 137.0000 ug | ORAL_TABLET | Freq: Every day | ORAL | 0 refills | Status: DC

## 2022-08-11 ENCOUNTER — Ambulatory Visit: Payer: Self-pay | Attending: Family Medicine | Admitting: Family Medicine

## 2022-08-11 ENCOUNTER — Encounter: Payer: Self-pay | Admitting: Family Medicine

## 2022-08-11 VITALS — BP 135/87 | HR 91 | Ht 60.0 in | Wt 171.0 lb

## 2022-08-11 DIAGNOSIS — Z131 Encounter for screening for diabetes mellitus: Secondary | ICD-10-CM

## 2022-08-11 DIAGNOSIS — E039 Hypothyroidism, unspecified: Secondary | ICD-10-CM

## 2022-08-11 DIAGNOSIS — Z1211 Encounter for screening for malignant neoplasm of colon: Secondary | ICD-10-CM

## 2022-08-11 NOTE — Progress Notes (Signed)
Subjective:  Patient ID: Crystal Fleming, female    DOB: June 14, 1975  Age: 47 y.o. MRN: [id number]  CC: Hypothyroidism   HPI Crystal Fleming Crystal Fleming is a 47 y.o. year old female with a history of hypothyroidism, here today for follow-up visit.  Interval History:  Her BP is elevated at 148/89 and she has no history of hypertension.  She has had some headaches on some occasions.  Repeat blood pressure performed manually returned at 135/87. She endorses adherence with levothyroxine and has no additional concerns today. She exercises regularly by means of walking.  Past Medical History:  Diagnosis Date   Hypothyroid 2010    No past surgical history on file.  Family History  Problem Relation Age of Onset   Hypertension Mother    Breast cancer Neg Hx     Social History   Socioeconomic History   Marital status: Married    Spouse name: Not on file   Number of children: 3   Years of education: Not on file   Highest education level: 3rd grade  Occupational History   Not on file  Tobacco Use   Smoking status: Never   Smokeless tobacco: Never  Vaping Use   Vaping Use: Never used  Substance and Sexual Activity   Alcohol use: No   Drug use: No   Sexual activity: Not Currently    Birth control/protection: None  Other Topics Concern   Not on file  Social History Narrative   Not on file   Social Determinants of Health   Financial Resource Strain: Not on file  Food Insecurity: Not on file  Transportation Needs: No Transportation Needs (09/04/2020)   PRAPARE - Transportation    Lack of Transportation (Medical): No    Lack of Transportation (Non-Medical): No  Physical Activity: Not on file  Stress: Not on file  Social Connections: Not on file    Allergies  Allergen Reactions   Penicillins     Outpatient Medications Prior to Visit  Medication Sig Dispense Refill   levothyroxine (SYNTHROID) 137 MCG tablet Take 1 tablet (137 mcg total) by mouth daily. 90  tablet 0   norgestimate-ethinyl estradiol (SPRINTEC 28) 0.25-35 MG-MCG tablet Take 1 tablet by mouth daily. 28 tablet 11   No facility-administered medications prior to visit.     ROS Review of Systems  Constitutional:  Negative for activity change and appetite change.  HENT:  Negative for sinus pressure and sore throat.   Respiratory:  Negative for chest tightness, shortness of breath and wheezing.   Cardiovascular:  Negative for chest pain and palpitations.  Gastrointestinal:  Negative for abdominal distention, abdominal pain and constipation.  Genitourinary: Negative.   Musculoskeletal: Negative.   Psychiatric/Behavioral:  Negative for behavioral problems and dysphoric mood.     Objective:  BP 135/87   Pulse 91   Ht 5' (1.524 m)   Wt 171 lb (77.6 kg)   SpO2 99%   BMI 33.40 kg/m      08/11/2022    2:33 PM 08/11/2022    2:10 PM 05/19/2022    8:59 AM  BP/Weight  Systolic BP 135 148 131  Diastolic BP 87 89 84  Wt. (Lbs)  171 172.2  BMI  33.4 kg/m2 31.5 kg/m2      Physical Exam Constitutional:      Appearance: She is well-developed.  Cardiovascular:     Rate and Rhythm: Normal rate.     Heart sounds: Normal heart sounds. No murmur heard. Pulmonary:  Effort: Pulmonary effort is normal.     Breath sounds: Normal breath sounds. No wheezing or rales.  Chest:     Chest wall: No tenderness.  Abdominal:     General: Bowel sounds are normal. There is no distension.     Palpations: Abdomen is soft. There is no mass.     Tenderness: There is no abdominal tenderness.  Musculoskeletal:        General: Normal range of motion.     Right lower leg: No edema.     Left lower leg: No edema.  Neurological:     Mental Status: She is alert and oriented to person, place, and time.  Psychiatric:        Mood and Affect: Mood normal.        Latest Ref Rng & Units 05/19/2022    9:50 AM 06/09/2021    9:15 AM 12/17/2020    4:10 PM  CMP  Glucose 70 - 99 mg/dL 95  96  98   BUN 6  - 24 mg/dL 12  15  14    Creatinine 0.57 - 1.00 mg/dL 1.61  0.96  0.45   Sodium 134 - 144 mmol/L 135  139  138   Potassium 3.5 - 5.2 mmol/L 4.6  4.2  4.3   Chloride 96 - 106 mmol/L 99  103  102   CO2 20 - 29 mmol/L 23  23  23    Calcium 8.7 - 10.2 mg/dL 9.0  8.9  9.2   Total Protein 6.0 - 8.5 g/dL 7.0  7.0    Total Bilirubin 0.0 - 1.2 mg/dL 0.7  1.0    Alkaline Phos 44 - 121 IU/L 62  59    AST 0 - 40 IU/L 11  12    ALT 0 - 32 IU/L 11  11      Lipid Panel     Component Value Date/Time   CHOL 227 (H) 05/19/2022 0950   TRIG 120 05/19/2022 0950   HDL 53 05/19/2022 0950   CHOLHDL 4.3 05/22/2019 1126   CHOLHDL 4.4 12/24/2015 1154   VLDL 39 (H) 12/24/2015 1154   LDLCALC 153 (H) 05/19/2022 0950    CBC    Component Value Date/Time   WBC 5.7 05/19/2022 0950   WBC 5.5 06/16/2016 0939   RBC 4.44 05/19/2022 0950   RBC 4.62 06/16/2016 0939   HGB 12.2 05/19/2022 0950   HCT 37.2 05/19/2022 0950   PLT 292 05/19/2022 0950   MCV 84 05/19/2022 0950   MCH 27.5 05/19/2022 0950   MCH 31.2 06/16/2016 0939   MCHC 32.8 05/19/2022 0950   MCHC 34.0 06/16/2016 0939   RDW 13.9 05/19/2022 0950   LYMPHSABS 1.9 05/19/2022 0950   MONOABS 385 06/16/2016 0939   EOSABS 0.0 05/19/2022 0950   BASOSABS 0.0 05/19/2022 0950    Lab Results  Component Value Date   HGBA1C 5.3 06/09/2021    Lab Results  Component Value Date   TSH 7.260 (H) 05/19/2022    Assessment & Plan:  1. Hypothyroidism, unspecified type Uncontrolled with elevated TSH from 05/2022 Will send off thyroid labs and adjust regimen accordingly - T4, free - TSH - T3  2. Screening for colon cancer - Fecal occult blood, imunochemical  3. Screening for diabetes mellitus - Hemoglobin A1c    No orders of the defined types were placed in this encounter.   Follow-up: Return in about 6 months (around 02/11/2023) for Chronic medical conditions.  Hoy Register, MD, FAAFP. [facility] and [facility]  [place], [name] [phone]   08/11/2022, 3:08 PM

## 2022-08-11 NOTE — Patient Instructions (Signed)
Cmo controlar su hipertensin Managing Your Hypertension La hipertensin, tambin conocida como presin arterial alta, se produce cuando la sangre ejerce presin contra las paredes de las arterias con demasiada fuerza. Las arterias son los vasos sanguneos que transportan la sangre desde el corazn hacia todas las partes del cuerpo. La hipertensin hace que el corazn haga ms esfuerzo para bombear la sangre y puede provocar que las arterias se estrechen o endurezcan. Qu significan las lecturas de la presin arterial Una lectura de la presin arterial consta de un nmero ms alto sobre un nmero ms bajo. El primer nmero, o nmero superior, es la presin sistlica. Es la medida de la presin de las arterias cuando el corazn late. El segundo nmero, o nmero inferior, es la presin diastlica. Es la medida de la presin en las arterias cuando el corazn se relaja. Para la mayora de las personas, una presin arterial normal est por debajo de 120/80. La presin arterial deseada puede variar en funcin de las enfermedades, la edad y otros factores personales. La presin arterial se clasifica en cuatro etapas. Sobre la base de la lectura de su presin arterial, el mdico puede usar las siguientes etapas para determinar si necesita tratamiento y de qu tipo. La presin sistlica y la presin diastlica se miden en una unidad llamada milmetros de mercurio (mm Hg). Normal Presin sistlica: por debajo de 120. Presin diastlica: por debajo de 80. Elevada Presin sistlica: 120-129. Presin diastlica: por debajo de 80. Etapa 1 de hipertensin Presin sistlica: 130-139. Presin diastlica: 80-89. Etapa 2 de hipertensin Presin sistlica: 140 o ms. Presin diastlica: 90 o ms. Cmo puede afectarme esta enfermedad? Controlar la hipertensin es muy importante. Con el transcurso del tiempo, la hipertensin puede daar las arterias y disminuir el flujo de sangre hacia partes del cuerpo que  incluyen el cerebro, el corazn y los riones. Tener hipertensin no tratada o no controlada puede causar: Infarto de miocardio. Accidente cerebrovascular. Debilitamiento de los vasos sanguneos (aneurisma). Insuficiencia cardaca. Dao renal. Dao ocular. Problemas de memoria y concentracin. Demencia vascular. Qu medidas puedo tomar para controlar esta afeccin? La hipertensin se puede controlar haciendo cambios en el estilo de vida y, posiblemente, tomando medicamentos. Su mdico le ayudar a crear un plan para bajar la presin arterial al rango normal. Es posible que lo deriven para que reciba asesoramiento sobre una dieta saludable y actividad fsica. Nutricin  Siga una dieta con alto contenido de fibras y potasio, y con bajo contenido de sal (sodio), azcar agregada y grasas. Un ejemplo de plan de alimentacin se denomina dieta DASH. DASH es la sigla en ingls de "Enfoques alimentarios para detener la hipertensin". Para alimentarse de esta manera: Coma mucha fruta y verdura fresca. Trate de que la mitad del plato de cada comida sea de frutas y verduras. Coma cereales integrales, como pasta integral, arroz integral o pan integral. Llene aproximadamente un cuarto del plato con cereales integrales. Consuma productos lcteos descremados. Evite la ingesta de cortes de carne grasa, carne procesada o curada, y carne de ave con piel. Llene aproximadamente un cuarto del plato con protenas magras, como pescado, pollo sin piel, frijoles, huevos o tofu. Evite ingerir alimentos prehechos y procesados. En general, estos tienen mayor cantidad de sodio, azcar agregada y grasa. Reduzca su ingesta diaria de sodio. Muchas personas que tienen hipertensin deben comer menos de 1500 mg de sodio por da. Estilo de vida  Trabaje con su mdico para mantener un peso saludable o perder peso. Pregntele cul es el peso recomendado   para usted. Realice al menos 30 minutos de ejercicio que haga que se acelere  su corazn (ejercicio aerbico) la mayora de los das de la semana. Estas actividades pueden incluir caminar, nadar o andar en bicicleta. Incluya ejercicios para fortalecer sus msculos (ejercicios de resistencia), como levantamiento de pesas, como parte de su rutina semanal de ejercicios. Intente realizar 30 minutos de este tipo de ejercicios al menos tres das a la semana. No consuma ningn producto que contenga nicotina o tabaco. Estos productos incluyen cigarrillos, tabaco para mascar y aparatos de vapeo, como los cigarrillos electrnicos. Si necesita ayuda para dejar de consumir estos productos, consulte al mdico. Controle las enfermedades a largo plazo (crnicas), como el colesterol alto o la diabetes. Identifique sus causas de estrs y encuentre maneras de controlar el estrs. Esto puede incluir meditacin, respiracin profunda o hacerse tiempo para realizar actividades divertidas. Consumo de alcohol No beba alcohol si: Su mdico le indica no hacerlo. Est embarazada, puede estar embarazada o est tratando de quedar embarazada. Si bebe alcohol: Limite la cantidad que bebe a lo siguiente: De 0 a 1 medida por da para las mujeres. De 0 a 2 medidas por da para los hombres. Sepa cunta cantidad de alcohol hay en las bebidas que toma. En los [place], una medida equivale a una botella de cerveza de 12 oz (355 ml), un vaso de vino de 5 oz (148 ml) o un vaso de una bebida alcohlica de alta graduacin de 1 oz (44 ml). Medicamentos [name] puede recetarle medicamentos si los cambios en el estilo de vida no son suficientes para lograr controlar la presin arterial y si: Su presin arterial sistlica es de 130 o ms. Su presin arterial diastlica es de 80 o ms. Use los medicamentos solamente como se lo haya indicado el mdico. Siga cuidadosamente las indicaciones. Los medicamentos para la presin arterial deben tomarse como se lo haya indicado el mdico. Los medicamentos pierden eficacia  al omitir las dosis. El hecho de omitir las dosis tambin aumenta el riesgo de otros problemas. Monitoreo Antes de controlarse la presin arterial: No fume, no consuma bebidas con cafena ni haga ejercicio dentro de los 30 minutos antes de tomar la medicin. Vaya al bao y vace la vejiga (orine). Permanezca sentado tranquilamente durante al menos 5 minutos antes de tomar las mediciones. Contrlese la presin arterial en su casa segn las indicaciones del mdico. Para hacer esto: Sintese con la espalda recta y con apoyo. Coloque los pies planos en el piso. No se cruce de piernas. Apoye el brazo sobre una superficie plana, como una mesa. Asegrese de que la parte superior del brazo est al nivel del corazn. Cada vez que tome una medicin, tome dos o tres lecturas con un minuto de separacin y anote los resultados. Posiblemente tambin sea necesario que el mdico le controle la presin arterial de manera regular. Informacin general Hable con su mdico acerca de la dieta, hbitos de ejercicio y otros factores del estilo de vida que pueden contribuir a la hipertensin. Revise con su mdico todos los medicamentos que toma ya que puede haber efectos secundarios o interacciones. Concurra a todas las visitas de seguimiento. [name] puede ayudarle a crear y ajustar su plan para controlar la presin arterial alta. Dnde obtener ms informacin National Heart, Lung, and Blood Institute (Instituto Nacional del Corazn, los Pulmones y la Sangre): www.nhlbi.nih.gov American Heart Association (Asociacin Estadounidense del Corazn): www.heart.org Comunquese con un mdico si: Piensa que tiene una reaccin alrgica a los medicamentos   que ha tomado. Tiene dolores de cabeza frecuentes (recurrentes). Siente mareos. Tiene hinchazn en los tobillos. Tiene problemas de visin. Solicite ayuda de inmediato si: Siente un dolor de cabeza intenso o confusin. Siente debilidad inusual, adormecimiento o que se  desmayar. Siente un dolor intenso en el pecho o el abdomen. Vomita repetidas veces. Tiene dificultad para respirar. Estos sntomas pueden indicar una emergencia. Solicite ayuda de inmediato. Llame al 911. No espere a ver si los sntomas desaparecen. No conduzca por sus propios medios hasta el hospital. Resumen La hipertensin se produce cuando la sangre bombea en las arterias con mucha fuerza. Si esta afeccin no se controla, podra correr riesgo de tener complicaciones graves. La presin arterial deseada puede variar en funcin de las enfermedades, la edad y otros factores personales. Para la mayora de las personas, una presin arterial normal es menor que 120/80. La hipertensin se puede controlar mediante cambios en el estilo de vida, tomando medicamentos, o ambas cosas. Los cambios en el estilo de vida para ayudar a controlar la hipertensin incluyen prdida de peso, seguir una dieta saludable, con bajo contenido de sodio, hacer ms ejercicio, dejar de fumar y limitar el consumo de alcohol. Esta informacin no tiene como fin reemplazar el consejo del mdico. Asegrese de hacerle al mdico cualquier pregunta que tenga. Document Revised: 12/29/2020 Document Reviewed: 12/29/2020 Elsevier Patient Education  2023 [employer].  

## 2022-08-12 ENCOUNTER — Other Ambulatory Visit: Payer: Self-pay | Admitting: Family Medicine

## 2022-08-12 DIAGNOSIS — E039 Hypothyroidism, unspecified: Secondary | ICD-10-CM

## 2022-08-12 LAB — HEMOGLOBIN A1C
Est. average glucose Bld gHb Est-mCnc: 114 mg/dL
Hgb A1c MFr Bld: 5.6 % (ref 4.8–5.6)

## 2022-08-12 LAB — T3: T3, Total: 78 ng/dL (ref 71–180)

## 2022-08-12 LAB — TSH: TSH: 3.29 u[IU]/mL (ref 0.450–4.500)

## 2022-08-12 LAB — T4, FREE: Free T4: 1.64 ng/dL (ref 0.82–1.77)

## 2022-08-12 MED ORDER — LEVOTHYROXINE SODIUM 137 MCG PO TABS
137.0000 ug | ORAL_TABLET | Freq: Every day | ORAL | 1 refills | Status: DC
Start: 2022-08-12 — End: 2022-12-24

## 2022-08-12 MED ORDER — LEVOTHYROXINE SODIUM 137 MCG PO TABS
137.0000 ug | ORAL_TABLET | Freq: Every day | ORAL | 1 refills | Status: DC
Start: 2022-08-12 — End: 2022-08-12

## 2022-12-22 ENCOUNTER — Ambulatory Visit: Payer: Self-pay | Attending: Family Medicine | Admitting: Family Medicine

## 2022-12-22 ENCOUNTER — Encounter: Payer: Self-pay | Admitting: Family Medicine

## 2022-12-22 VITALS — BP 153/97 | HR 80 | Ht 60.0 in | Wt 176.4 lb

## 2022-12-22 DIAGNOSIS — E782 Mixed hyperlipidemia: Secondary | ICD-10-CM

## 2022-12-22 DIAGNOSIS — I10 Essential (primary) hypertension: Secondary | ICD-10-CM

## 2022-12-22 DIAGNOSIS — Z1211 Encounter for screening for malignant neoplasm of colon: Secondary | ICD-10-CM

## 2022-12-22 DIAGNOSIS — E039 Hypothyroidism, unspecified: Secondary | ICD-10-CM

## 2022-12-22 MED ORDER — AMLODIPINE BESYLATE 2.5 MG PO TABS: 5.0000 mg | ORAL_TABLET | Freq: Every day | ORAL | 1 refills | Status: DC

## 2022-12-22 NOTE — Progress Notes (Signed)
Subjective:  Patient ID: Crystal Fleming, female    DOB: 1975-05-16  Age: 47 y.o. MRN: [id number]  CC: Medical Management of Chronic Issues   HPI Tasheika Arzate [name] is a 47 y.o. year old female with a history of hypothyroidism, here today for follow-up visit.   Interval History: Discussed the use of AI scribe software for clinical note transcription with the patient, who gave verbal consent to proceed.  She presents for a routine follow-up. She reports no new concerns. She has been adhering to her thyroid medication regimen daily. She also reports regular bowel movements and no issues with urination. She has been engaging in regular exercise, approximately 30 to 40 minutes daily.  Her blood pressure is elevated and it was also elevated at her last office visit.  She has no known history of hypertension       Past Medical History:  Diagnosis Date   Hypothyroid 2010    No past surgical history on file.  Family History  Problem Relation Age of Onset   Hypertension Mother    Breast cancer Neg Hx     Social History   Socioeconomic History   Marital status: Married    Spouse name: Not on file   Number of children: 3   Years of education: Not on file   Highest education level: 3rd grade  Occupational History   Not on file  Tobacco Use   Smoking status: Never   Smokeless tobacco: Never  Vaping Use   Vaping status: Never Used  Substance and Sexual Activity   Alcohol use: No   Drug use: No   Sexual activity: Not Currently    Birth control/protection: None  Other Topics Concern   Not on file  Social History Narrative   Not on file   Social Determinants of Health   Financial Resource Strain: Not on file  Food Insecurity: Not on file  Transportation Needs: No Transportation Needs (09/04/2020)   PRAPARE - Transportation    Lack of Transportation (Medical): No    Lack of Transportation (Non-Medical): No  Physical Activity: Not on file  Stress: Not on  file  Social Connections: Not on file    Allergies  Allergen Reactions   Penicillins     Outpatient Medications Prior to Visit  Medication Sig Dispense Refill   levothyroxine (SYNTHROID) 137 MCG tablet Take 1 tablet (137 mcg total) by mouth daily. 90 tablet 1   norgestimate-ethinyl estradiol (SPRINTEC 28) 0.25-35 MG-MCG tablet Take 1 tablet by mouth daily. 28 tablet 11   No facility-administered medications prior to visit.     ROS Review of Systems  Constitutional:  Negative for activity change and appetite change.  HENT:  Negative for sinus pressure and sore throat.   Respiratory:  Negative for chest tightness, shortness of breath and wheezing.   Cardiovascular:  Negative for chest pain and palpitations.  Gastrointestinal:  Negative for abdominal distention, abdominal pain and constipation.  Genitourinary: Negative.   Musculoskeletal: Negative.   Psychiatric/Behavioral:  Negative for behavioral problems and dysphoric mood.     Objective:  BP (!) 153/97   Pulse 80   Ht 5' (1.524 m)   Wt 176 lb 6.4 oz (80 kg)   SpO2 98%   BMI 34.45 kg/m      12/22/2022   11:04 AM 12/22/2022   10:37 AM 08/11/2022    2:33 PM  BP/Weight  Systolic BP 153 153 135  Diastolic BP 97 107 87  Wt. (Lbs)  176.4  BMI  34.45 kg/m2       Physical Exam Constitutional:      Appearance: She is well-developed.  Cardiovascular:     Rate and Rhythm: Normal rate.     Heart sounds: Normal heart sounds. No murmur heard. Pulmonary:     Effort: Pulmonary effort is normal.     Breath sounds: Normal breath sounds. No wheezing or rales.  Chest:     Chest wall: No tenderness.  Abdominal:     General: Bowel sounds are normal. There is no distension.     Palpations: Abdomen is soft. There is no mass.     Tenderness: There is no abdominal tenderness.  Musculoskeletal:        General: Normal range of motion.     Right lower leg: No edema.     Left lower leg: No edema.  Neurological:     Mental  Status: She is alert and oriented to person, place, and time.  Psychiatric:        Mood and Affect: Mood normal.        Latest Ref Rng & Units 05/19/2022    9:50 AM 06/09/2021    9:15 AM 12/17/2020    4:10 PM  CMP  Glucose 70 - 99 mg/dL 95  96  98   BUN 6 - 24 mg/dL 12  15  14    Creatinine 0.57 - 1.00 mg/dL 1.47  8.29  5.62   Sodium 134 - 144 mmol/L 135  139  138   Potassium 3.5 - 5.2 mmol/L 4.6  4.2  4.3   Chloride 96 - 106 mmol/L 99  103  102   CO2 20 - 29 mmol/L 23  23  23    Calcium 8.7 - 10.2 mg/dL 9.0  8.9  9.2   Total Protein 6.0 - 8.5 g/dL 7.0  7.0    Total Bilirubin 0.0 - 1.2 mg/dL 0.7  1.0    Alkaline Phos 44 - 121 IU/L 62  59    AST 0 - 40 IU/L 11  12    ALT 0 - 32 IU/L 11  11      Lipid Panel     Component Value Date/Time   CHOL 227 (H) 05/19/2022 0950   TRIG 120 05/19/2022 0950   HDL 53 05/19/2022 0950   CHOLHDL 4.3 05/22/2019 1126   CHOLHDL 4.4 12/24/2015 1154   VLDL 39 (H) 12/24/2015 1154   LDLCALC 153 (H) 05/19/2022 0950    CBC    Component Value Date/Time   WBC 5.7 05/19/2022 0950   WBC 5.5 06/16/2016 0939   RBC 4.44 05/19/2022 0950   RBC 4.62 06/16/2016 0939   HGB 12.2 05/19/2022 0950   HCT 37.2 05/19/2022 0950   PLT 292 05/19/2022 0950   MCV 84 05/19/2022 0950   MCH 27.5 05/19/2022 0950   MCH 31.2 06/16/2016 0939   MCHC 32.8 05/19/2022 0950   MCHC 34.0 06/16/2016 0939   RDW 13.9 05/19/2022 0950   LYMPHSABS 1.9 05/19/2022 0950   MONOABS 385 06/16/2016 0939   EOSABS 0.0 05/19/2022 0950   BASOSABS 0.0 05/19/2022 0950    Lab Results  Component Value Date   HGBA1C 5.6 08/11/2022    Lab Results  Component Value Date   TSH 3.290 08/11/2022    The 10-year ASCVD risk score (Arnett DK, et al., 2019) is: 2.2%   Values used to calculate the score:     Age: 9 years     Sex: Female  Is Non-Hispanic African American: No     Diabetic: No     Tobacco smoker: No     Systolic Blood Pressure: 153 mmHg     Is BP treated: Yes     HDL  Cholesterol: 53 mg/dL     Total Cholesterol: 227 mg/dL  Assessment & Plan:      Hypertension Persistently elevated blood pressure. Initiation of antihypertensive therapy discussed. -Start Amlodipine 2.5mg  daily. -Recheck blood pressure in one week with the nurse. If blood pressure remains above goal, increase Amlodipine to 5mg  daily. -Encouraged low sodium diet, regular exercise, and weight loss.  Hyperlipidemia Elevated cholesterol noted on previous labs. Low 10-year ASCVD risk of 2.2% -Repeat lipid panel today. Low-cholesterol diet  Hypothyroidism On thyroid replacement therapy. -Repeat thyroid function tests today. -Refill thyroid medication once results are available and dose is confirmed.  Colon Cancer Screening Due for screening. -Send home fecal immunochemical test (FIT) kit for colon cancer screening.           Meds ordered this encounter  Medications   amLODipine (NORVASC) 2.5 MG tablet    Sig: Take 2 tablets (5 mg total) by mouth daily.    Dispense:  90 tablet    Refill:  1    Follow-up: Return in about 1 week (around 12/29/2022) for nurse visit, BP check, Medical conditions with PCP, in 6 months.       Hoy Register, MD, FAAFP. [facility] [place], [name] [phone]   12/22/2022, 12:33 PM

## 2022-12-22 NOTE — Patient Instructions (Signed)
Cmo controlar su hipertensin Managing Your Hypertension La hipertensin, tambin conocida como presin arterial alta, se produce cuando la sangre ejerce presin contra las paredes de las arterias con [place] fuerza. Las arterias son los vasos sanguneos que transportan la sangre desde el corazn hacia todas las partes del cuerpo. La hipertensin hace que el corazn haga ms esfuerzo para Marine scientist y puede provocar que las arterias se Armed forces training and education officer o Multimedia programmer. Qu significan las lecturas de la presin arterial Una lectura de la presin arterial consta de un nmero ms alto sobre un nmero ms bajo. El Gaffer, o nmero superior, es la presin sistlica. Es la medida de la presin de las arterias cuando el corazn late. El segundo nmero, o nmero inferior, es la presin diastlica. Es la medida de la presin en las arterias cuando el corazn se relaja. Para la [employer], [place] presin arterial normal est por debajo de 120/80. La presin arterial deseada puede variar en funcin de las enfermedades, la edad y otros factores personales. La presin arterial se clasifica en cuatro etapas. Sobre la base de la lectura de su presin arterial, el mdico puede usar las siguientes etapas para determinar si necesita tratamiento y de qu tipo. La presin sistlica y la presin diastlica se miden en una unidad llamada milmetros de mercurio (mm Hg). Normal Presin sistlica: por debajo de 120. Presin diastlica: por debajo de 80. Elevada Presin sistlica: 120-129. Presin diastlica: por debajo de 80. Etapa 1 de hipertensin Presin sistlica: 130-139. Presin diastlica: 80-89. Etapa 2 de hipertensin Presin sistlica: 140 o ms. Presin diastlica: 90 o ms. Cmo puede afectarme esta enfermedad? Controlar la hipertensin es [employer]. Con el transcurso del [place], la hipertensin puede daar las arterias y Technical sales engineer el flujo de sangre hacia partes del cuerpo que  incluyen el cerebro, el corazn y los riones. Tener hipertensin no tratada o no controlada puede causar: Infarto de miocardio. Accidente cerebrovascular. Debilitamiento de los vasos sanguneos (aneurisma). Insuficiencia cardaca. Dao renal. Dao ocular. Problemas de memoria y Librarian, academic. Demencia vascular. Qu medidas puedo tomar para controlar esta afeccin? La hipertensin se puede controlar haciendo [employer] estilo de vida y, posiblemente, tomando medicamentos. Su mdico le ayudar a crear un plan para bajar la presin arterial al rango normal. Es posible que lo deriven para que reciba asesoramiento sobre una dieta saludable y [place] fsica. Nutricin  Siga una dieta con alto contenido de fibras y [place], y con bajo contenido de sal (sodio), azcar agregada y Rosalin Hawking. Un ejemplo de plan de alimentacin se denomina dieta DASH. DASH es la sigla en ingls de "Enfoques alimentarios para detener la hipertensin". Para alimentarse de esta manera: Coma mucha fruta y verdura fresca. Trate de que la mitad del plato de cada comida sea de frutas y verduras. Coma cereales integrales, como pasta integral, arroz integral o pan integral. Llene aproximadamente un cuarto del plato con cereales integrales. Consuma productos lcteos descremados. Evite la ingesta de cortes de carne grasa, carne procesada o curada, y carne de ave con piel. Llene aproximadamente un cuarto del plato con protenas magras, como pescado, pollo sin piel, frijoles, huevos o tofu. Evite ingerir alimentos prehechos y procesados. En general, estos tienen mayor cantidad de sodio, azcar agregada y Steffanie [place]. Reduzca su ingesta diaria de sodio. Muchas personas que tienen hipertensin deben comer menos de 1500 mg de [employer]. Estilo de vida  Trabaje con su mdico para mantener un peso saludable o Curator. Pregntele cul es el peso recomendado  para usted. Realice al menos 30 minutos de ejercicio que haga que se acelere  su corazn (ejercicio Magazine features editor) la [employer]V de la [place]. Estas actividades pueden incluir caminar, nadar o andar en bicicleta. Incluya ejercicios para fortalecer sus msculos (ejercicios de resistencia), como levantamiento de pesas, como parte de su rutina semanal de ejercicios. Intente realizar 30 minutos de este tipo de ejercicios al Kellogg a la [place]. No consuma ningn producto que contenga nicotina o tabaco. Estos productos incluyen cigarrillos, tabaco para Theatre manager y aparatos de vapeo, como los Administrator, Civil Service. Si necesita ayuda para dejar de consumir estos productos, consulte al American [employer]. Controle las enfermedades a largo plazo (crnicas), como el colesterol alto o la diabetes. Identifique sus causas de estrs y encuentre maneras de Charity fundraiser. Esto puede incluir meditacin, respiracin profunda o hacerse tiempo para Arts development officer divertidas. Consumo de alcohol No beba alcohol si: Su mdico le indica no hacerlo. Est embarazada, puede estar embarazada o est tratando de [place]. Si bebe alcohol: Limite la cantidad que bebe a lo siguiente: De 0 a 1 medida por da para las mujeres. De 0 a 2 medidas por da para los hombres. Sepa cunta cantidad de alcohol hay en las bebidas que toma. En los [address], una medida equivale a una botella de cerveza de 12 oz (355 ml), un vaso de vino de 5 oz (148 ml) o un vaso de una bebida alcohlica de alta graduacin de 1 oz (44 ml). Medicamentos [name] puede recetarle medicamentos si los cambios en el estilo de vida no son suficientes para Museum/gallery curator la presin arterial y si: Su presin arterial sistlica es de 130 o ms. Su presin arterial diastlica es de 80 o ms. Use los medicamentos solamente como se lo haya indicado el mdico. Siga cuidadosamente las indicaciones. Los medicamentos para la presin arterial deben tomarse como se lo haya indicado el mdico. Los medicamentos pierden eficacia  al omitir las dosis. El hecho de omitir las dosis tambin [place] el riesgo de otros problemas. Monitoreo Antes de [employer] presin arterial: No fume, no consuma bebidas con cafena ni haga ejercicio dentro de los 30 minutos antes de tomar la medicin. Vaya al bao y vace la vejiga (orine). Permanezca sentado tranquilamente durante al menos 5 minutos antes de tomar las mediciones. Contrlese la presin arterial en su casa segn las indicaciones del mdico. Para hacer esto: Sintese con la espalda recta y con apoyo. Coloque los pies planos en el piso. No se cruce de piernas. Apoye el brazo sobre una superficie plana, como una mesa. Asegrese de que la parte superior del brazo est al nivel del corazn. Cada vez que tome una medicin, tome dos o tres lecturas con un minuto de separacin y [employer] Brands. Posiblemente tambin sea necesario que el mdico le controle la presin arterial de [place] regular. Informacin general Hable con su mdico acerca de la dieta, hbitos de ejercicio y otros factores del estilo de vida que pueden contribuir a la hipertensin. Revise con su mdico todos los medicamentos que toma ya que puede haber efectos secundarios o interacciones. Concurra a todas las visitas de seguimiento. [name] puede ayudarle a crear y Dawayne Patricia su plan para controlar la presin arterial alta. Dnde obtener ms informacin [employer], Lung, and Blood Institute (Instituto Nacional del [place], los Pulmones y Risk manager): PopSteam.is American Heart Association (Asociacin Estadounidense del Corazn): www.heart.org Comunquese con un mdico si: Piensa que tiene Runner, broadcasting/film/video a los medicamentos  que ha tomado. Tiene dolores de cabeza frecuentes (recurrentes). Siente mareos. Tiene hinchazn en los tobillos. Tiene problemas de visin. Solicite ayuda de inmediato si: Siente un dolor de cabeza intenso o confusin. Siente debilidad inusual, adormecimiento o que Nurse, learning disability. Siente un dolor intenso en el pecho o el abdomen. Vomita repetidas veces. Tiene dificultad para respirar. Estos sntomas pueden Customer service manager. Solicite ayuda de inmediato. Llame al 911. No espere a ver si los sntomas desaparecen. No conduzca por sus propios medios [employer] hospital. Resumen La hipertensin se produce cuando la sangre bombea en las arterias con mucha fuerza. Si esta afeccin no se controla, podra correr riesgo de tener complicaciones graves. La presin arterial deseada puede variar en funcin de las enfermedades, la edad y otros factores personales. Para la [employer], una presin arterial normal es menor que 120/80. La hipertensin se puede controlar mediante cambios en el estilo de vida, tomando medicamentos, o ambas cosas. Los [employer] estilo de vida para ayudar a Chief Operating Officer la hipertensin incluyen prdida de [place], seguir una dieta saludable, con bajo contenido de sodio, hacer ms ejercicio, dejar de fumar y limitar el consumo de alcohol. Esta informacin no tiene Theme park manager el consejo del mdico. Asegrese de hacerle al mdico cualquier pregunta que tenga. Document Revised: 12/29/2020 Document Reviewed: 12/29/2020 Elsevier Patient Education  2024 [name].

## 2022-12-23 LAB — CMP14+EGFR
ALT: 12 IU/L (ref 0–32)
AST: 16 IU/L (ref 0–40)
Albumin: 4.1 g/dL (ref 3.9–4.9)
Alkaline Phosphatase: 55 IU/L (ref 44–121)
BUN/Creatinine Ratio: 9 (ref 9–23)
BUN: 7 mg/dL (ref 6–24)
Bilirubin Total: 0.7 mg/dL (ref 0.0–1.2)
CO2: 22 mmol/L (ref 20–29)
Calcium: 8.8 mg/dL (ref 8.7–10.2)
Chloride: 100 mmol/L (ref 96–106)
Creatinine, Ser: 0.74 mg/dL (ref 0.57–1.00)
Globulin, Total: 3 g/dL (ref 1.5–4.5)
Glucose: 93 mg/dL (ref 70–99)
Potassium: 4.5 mmol/L (ref 3.5–5.2)
Sodium: 138 mmol/L (ref 134–144)
Total Protein: 7.1 g/dL (ref 6.0–8.5)
eGFR: 101 mL/min/{1.73_m2} (ref >59)

## 2022-12-23 LAB — TSH: TSH: 5.81 u[IU]/mL — ABNORMAL HIGH (ref 0.450–4.500)

## 2022-12-23 LAB — LP+NON-HDL CHOLESTEROL
Cholesterol, Total: 229 mg/dL — ABNORMAL HIGH (ref 100–199)
HDL: 48 mg/dL (ref >39)
LDL Chol Calc (NIH): 149 mg/dL — ABNORMAL HIGH (ref 0–99)
Total Non-HDL-Chol (LDL+VLDL): 181 mg/dL — ABNORMAL HIGH (ref 0–129)
Triglycerides: 177 mg/dL — ABNORMAL HIGH (ref 0–149)
VLDL Cholesterol Cal: 32 mg/dL (ref 5–40)

## 2022-12-23 LAB — T3, FREE: T3, Free: 2.6 pg/mL (ref 2.0–4.4)

## 2022-12-23 LAB — T4, FREE: Free T4: 1.49 ng/dL (ref 0.82–1.77)

## 2022-12-24 ENCOUNTER — Other Ambulatory Visit: Payer: Self-pay | Admitting: Family Medicine

## 2022-12-24 DIAGNOSIS — E039 Hypothyroidism, unspecified: Secondary | ICD-10-CM

## 2022-12-24 MED ORDER — LEVOTHYROXINE SODIUM 150 MCG PO TABS: 137.0000 ug | ORAL_TABLET | Freq: Every day | ORAL | 1 refills | Status: DC

## 2022-12-30 ENCOUNTER — Ambulatory Visit: Payer: Self-pay | Attending: Family Medicine | Admitting: *Deleted

## 2022-12-30 VITALS — BP 156/98

## 2022-12-30 DIAGNOSIS — I10 Essential (primary) hypertension: Secondary | ICD-10-CM

## 2022-12-30 MED ORDER — AMLODIPINE BESYLATE 10 MG PO TABS
10.0000 mg | ORAL_TABLET | Freq: Every day | ORAL | 0 refills | Status: DC
Start: 2022-12-30 — End: 2022-12-30

## 2022-12-30 MED ORDER — AMLODIPINE BESYLATE 10 MG PO TABS
10.0000 mg | ORAL_TABLET | Freq: Every day | ORAL | 0 refills | Status: DC
Start: 2022-12-30 — End: 2023-06-21

## 2022-12-30 MED ORDER — AMLODIPINE BESYLATE 10 MG PO TABS: 10.0000 mg | ORAL_TABLET | Freq: Every day | ORAL | 0 refills | Status: DC

## 2022-12-30 NOTE — Patient Instructions (Addendum)
New order will be Amlodipine 10 mg daily.  Recheck BP in 2 weeks.  For headaches  or dizziness please our call office Advised of BP goal- 130/80.    El nuevo pedido ser Amlodipino 10 mg al da.  Vuelva a controlar la presin arterial en 2 semanas.  Para dolores de cabeza o mareos por favor llame a nuestra oficina. Se le notific el objetivo de presin arterial: 130/80.

## 2022-12-30 NOTE — Progress Notes (Signed)
Blood Pressure Recheck Visit  Name: Crystal Fleming MRN: [id number] Date of Birth: 23-Jun-1975  [place] Arzate [name] presents today for Blood Pressure recheck with clinical support staff.  Order for BP recheck by Dr. Alvis Lemmings, ordered on 12/22/2022.   BP Readings from Last 3 Encounters:  12/30/22 (!) 148/98  12/22/22 (!) 153/97  08/11/22 135/87    Current Outpatient Medications  Medication Sig Dispense Refill   amLODipine (NORVASC) 2.5 MG tablet Take 2 tablets (5 mg total) by mouth daily. 90 tablet 1   levothyroxine (SYNTHROID) 150 MCG tablet Take 1 tablet (150 mcg total) by mouth daily. 90 tablet 1   norgestimate-ethinyl estradiol (SPRINTEC 28) 0.25-35 MG-MCG tablet Take 1 tablet by mouth daily. 28 tablet 11   No current facility-administered medications for this visit.    Hypertensive Medication Review: Patient states that they are taking all their hypertensive medications as prescribed and their last dose of hypertensive medications was  last night. She take her medication twice daily.     Documentation of any medication adherence discrepancies: none  Provider Recommendation:  Spoke to Dr. Alvis Lemmings and they stated to increase Amlodipine to 10 mg daily. Patient advised to call the office if she has headaches or dizziness.   Patient has been scheduled to follow up with Nurse visit in 2 weeks. Thursday October 10,2024  Patient has been given provider's recommendations and does not have any questions or concerns at this time. Patient will contact the office for any future questions or concerns.

## 2022-12-31 LAB — FECAL OCCULT BLOOD, IMMUNOCHEMICAL: Fecal Occult Bld: NEGATIVE

## 2023-01-11 ENCOUNTER — Telehealth: Payer: Self-pay

## 2023-01-11 NOTE — Telephone Encounter (Signed)
Patient called requesting mammogram scholarship application. Mailed patient scholarship application. Interpreter, Heinz Knuckles

## 2023-01-13 ENCOUNTER — Ambulatory Visit: Payer: Self-pay | Attending: Family Medicine

## 2023-01-13 VITALS — BP 126/78 | HR 80

## 2023-01-13 DIAGNOSIS — Z0130 Encounter for examination of blood pressure without abnormal findings: Secondary | ICD-10-CM

## 2023-01-13 NOTE — Progress Notes (Signed)
   Blood Pressure Recheck Visit  Name: Crystal Fleming MRN: [id number] Date of Birth: 1975-11-29  Crystal Fleming Crystal Fleming presents today for Blood Pressure recheck with clinical support staff.  Order for BP recheck by Bunnie Philips  ordered on 01/13/2023    BP Readings from Last 3 Encounters:  01/13/23 126/78  12/30/22 (!) 156/98  12/22/22 (!) 153/97    Current Outpatient Medications  Medication Sig Dispense Refill   amLODipine (NORVASC) 10 MG tablet Take 1 tablet (10 mg total) by mouth daily. 90 tablet 0   levothyroxine (SYNTHROID) 150 MCG tablet Take 1 tablet (150 mcg total) by mouth daily. 90 tablet 1   norgestimate-ethinyl estradiol (SPRINTEC 28) 0.25-35 MG-MCG tablet Take 1 tablet by mouth daily. 28 tablet 11   No current facility-administered medications for this visit.    Hypertensive Medication Review: Patient states that they are taking all their hypertensive medications as prescribed and their last dose of hypertensive medications was yesterday in the P.M.  Documentation of any medication adherence discrepancies: none  Provider Recommendation:  Spoke to Dr. Alvis Lemmings and they stated: No change in BP Medication.  Patient has been scheduled to follow up with PCP as scheduled   Patient has been given provider's recommendations and does not have any questions or concerns at this time. Patient will contact the office for any future questions or concerns.

## 2023-01-13 NOTE — Progress Notes (Signed)
Interpreter # [phone] for today's appointment

## 2023-02-07 ENCOUNTER — Ambulatory Visit: Payer: Self-pay | Attending: Family Medicine

## 2023-02-07 DIAGNOSIS — E039 Hypothyroidism, unspecified: Secondary | ICD-10-CM

## 2023-02-08 LAB — T4, FREE: Free T4: 1.44 ng/dL (ref 0.82–1.77)

## 2023-02-08 LAB — TSH: TSH: 2.5 u[IU]/mL (ref 0.450–4.500)

## 2023-02-08 LAB — T3: T3, Total: 93 ng/dL (ref 71–180)

## 2023-02-11 ENCOUNTER — Other Ambulatory Visit (INDEPENDENT_AMBULATORY_CARE_PROVIDER_SITE_OTHER): Payer: Self-pay | Admitting: *Deleted

## 2023-02-11 DIAGNOSIS — E039 Hypothyroidism, unspecified: Secondary | ICD-10-CM

## 2023-02-11 MED ORDER — LEVOTHYROXINE SODIUM 150 MCG PO TABS: 137.0000 ug | ORAL_TABLET | Freq: Every day | ORAL | 1 refills | Status: DC

## 2023-02-15 ENCOUNTER — Telehealth: Payer: Self-pay

## 2023-02-15 NOTE — Telephone Encounter (Signed)
[employer]ia [employer], Joni Reining # [phone], attempted to return call (regarding breast pain). Left message on voicemail requesting a return call.

## 2023-02-17 ENCOUNTER — Other Ambulatory Visit: Payer: Self-pay

## 2023-02-17 DIAGNOSIS — N644 Mastodynia: Secondary | ICD-10-CM

## 2023-02-18 ENCOUNTER — Ambulatory Visit: Payer: Self-pay | Admitting: Internal Medicine

## 2023-04-18 ENCOUNTER — Other Ambulatory Visit: Payer: Self-pay | Admitting: Family Medicine

## 2023-04-26 ENCOUNTER — Ambulatory Visit: Payer: Self-pay | Admitting: *Deleted

## 2023-04-26 ENCOUNTER — Ambulatory Visit
Admission: RE | Admit: 2023-04-26 | Discharge: 2023-04-26 | Disposition: A | Discharge status: Home / Self Care | Payer: No Typology Code available for payment source | Source: Ambulatory Visit | Attending: Obstetrics and Gynecology | Admitting: Obstetrics and Gynecology

## 2023-04-26 ENCOUNTER — Ambulatory Visit: Payer: Self-pay

## 2023-04-26 VITALS — BP 161/82 | Wt 179.5 lb

## 2023-04-26 DIAGNOSIS — N644 Mastodynia: Secondary | ICD-10-CM

## 2023-04-26 DIAGNOSIS — Z1239 Encounter for other screening for malignant neoplasm of breast: Secondary | ICD-10-CM

## 2023-04-26 NOTE — Progress Notes (Signed)
Crystal Fleming is a 48 y.o. female who presents to [facility] today with complaint of left outer breast pain x 2-3 weeks that comes and goes. Patient rates the pain at a 7-8 out of 10.    Pap Smear: Pap smear not completed today. Last Pap smear was 02/06/2020 at [facility] and was normal with negative HPV. Per patient has no history of an abnormal Pap smear. Last Pap smear result is available in Epic.    Physical exam: Breasts Breasts symmetrical. No skin abnormalities bilateral breasts. No nipple retraction bilateral breasts. No nipple discharge bilateral breasts. No lymphadenopathy. No lumps palpated bilateral breasts. Complaints of left outer breast pain on exam.  MS DIGITAL SCREENING TOMO BILATERAL Result Date: 12/02/2021 CLINICAL DATA:  Screening. EXAM: DIGITAL SCREENING BILATERAL MAMMOGRAM WITH TOMOSYNTHESIS AND CAD TECHNIQUE: Bilateral screening digital craniocaudal and mediolateral oblique mammograms were obtained. Bilateral screening digital breast tomosynthesis was performed. The images were evaluated with computer-aided detection. COMPARISON:  Previous exam(s). ACR Breast Density Category c: The breast tissue is heterogeneously dense, which may obscure small masses. FINDINGS: There are no findings suspicious for malignancy. IMPRESSION: No mammographic evidence of malignancy. A result letter of this screening mammogram will be mailed directly to the patient. RECOMMENDATION: Screening mammogram in one year. (Code:SM-B-01Y) BI-RADS CATEGORY  1: Negative. Electronically Signed   By: Harmon Pier M.D.   On: 12/02/2021 09:43   MS DIGITAL SCREENING TOMO BILATERAL Result Date: 09/18/2020 CLINICAL DATA:  Screening. EXAM: DIGITAL SCREENING BILATERAL MAMMOGRAM WITH TOMOSYNTHESIS AND CAD TECHNIQUE: Bilateral screening digital craniocaudal and mediolateral oblique mammograms were obtained. Bilateral screening digital breast tomosynthesis was performed.  The images were evaluated with computer-aided detection. COMPARISON:  Previous exam(s). ACR Breast Density Category c: The breast tissue is heterogeneously dense, which may obscure small masses. FINDINGS: There are no findings suspicious for malignancy. The images were evaluated with computer-aided detection. IMPRESSION: No mammographic evidence of malignancy. A result letter of this screening mammogram will be mailed directly to the patient. RECOMMENDATION: Screening mammogram in one year. (Code:SM-B-01Y) BI-RADS CATEGORY  1: Negative. Electronically Signed   By: Amie Portland M.D.   On: 09/18/2020 10:36   MS DIGITAL SCREENING TOMO BILATERAL Result Date: 06/26/2019 CLINICAL DATA:  Screening. EXAM: DIGITAL SCREENING BILATERAL MAMMOGRAM WITH TOMO AND CAD COMPARISON:  Previous exam(s). ACR Breast Density Category d: The breast tissue is extremely dense, which lowers the sensitivity of mammography FINDINGS: There are no findings suspicious for malignancy. Images were processed with CAD. IMPRESSION: No mammographic evidence of malignancy. A result letter of this screening mammogram will be mailed directly to the patient. RECOMMENDATION: Screening mammogram in one year. (Code:SM-B-01Y) BI-RADS CATEGORY  1: Negative. Electronically Signed   By: Edwin Cap M.D.   On: 06/26/2019 12:55    Pelvic/Bimanual Pap is not indicated today per BCCCP guidelines.   Smoking History: Patient has never smoked.   Patient Navigation: Patient education provided. Access to services provided for patient through [place] program. Spanish interpreter [name] from [facility] provided.   Colorectal Cancer Screening: Per patient has never had colonoscopy completed. Patient completed a FIT Test at her PCP's office 12/30/2022 that was negative. No complaints today.    Breast and Cervical Cancer Risk Assessment: Patient has family history of maternal first cousin having breast cancer. Patient has no known genetic mutations or  history of radiation treatment to the chest before age 34. Patient does not have history of cervical dysplasia, immunocompromised, or DES exposure in-utero.  Risk Scores as of Encounter on 04/26/2023     Crystal Fleming           5-year 0.61%   Lifetime 6.91%   Because the patient's race/ethnicity is unknown, the model is using data for white patients and might not be accurate.         Last calculated by Meryl Dare, CMA on 04/26/2023 at  8:21 AM        A: BCCCP exam without pap smear Complaint of left outer breast pain.  P: Referred patient to the [facility] for a diagnostic mammogram. Appointment scheduled Tuesday, April 26, 2023 at 0940.  Crystal Heidelberg, RN 04/26/2023 8:43 AM

## 2023-04-26 NOTE — Patient Instructions (Signed)
Explained breast self awareness with Rushie Goltz. Patient did not need a Pap smear today due to last Pap smear and HPV typing was 02/06/2020. Let her know BCCCP will cover Pap smears and HPV typing every 5 years unless has a history of abnormal Pap smears. Referred patient to the [facility] for a diagnostic mammogram. Appointment scheduled Tuesday, April 26, 2023 at 0940. Patient aware of appointment and will be there. Eymi Arzate [name] verbalized understanding.  Cadden Elizondo, Kathaleen Maser, RN 8:43 AM

## 2023-06-16 ENCOUNTER — Telehealth: Payer: Self-pay

## 2023-06-16 NOTE — Telephone Encounter (Signed)
 Copied from CRM [id number]. Topic: Clinical - Prescription Issue >> Jun 16, 2023 10:55 AM Fuller Mandril wrote: Reason for CRM: Pharmacy called with request to change manufacturer for levothyroxine (SYNTHROID) 150 MCG tablet to [facility] for refill. Can call or fax authorization to change manufacturer. Thank You

## 2023-06-17 NOTE — Telephone Encounter (Signed)
 Pharmacy was called and given PCP response.

## 2023-06-17 NOTE — Telephone Encounter (Signed)
 Okay to change?

## 2023-06-18 ENCOUNTER — Other Ambulatory Visit: Payer: Self-pay | Admitting: Family Medicine

## 2023-06-18 DIAGNOSIS — I10 Essential (primary) hypertension: Secondary | ICD-10-CM

## 2023-06-21 ENCOUNTER — Encounter: Payer: Self-pay | Admitting: Family Medicine

## 2023-06-21 ENCOUNTER — Ambulatory Visit: Payer: Self-pay | Attending: Family Medicine | Admitting: Family Medicine

## 2023-06-21 VITALS — BP 139/84 | HR 82 | Ht 60.0 in | Wt 176.0 lb

## 2023-06-21 DIAGNOSIS — E039 Hypothyroidism, unspecified: Secondary | ICD-10-CM

## 2023-06-21 DIAGNOSIS — I10 Essential (primary) hypertension: Secondary | ICD-10-CM

## 2023-06-21 DIAGNOSIS — E785 Hyperlipidemia, unspecified: Secondary | ICD-10-CM

## 2023-06-21 DIAGNOSIS — M79605 Pain in left leg: Secondary | ICD-10-CM

## 2023-06-21 DIAGNOSIS — E782 Mixed hyperlipidemia: Secondary | ICD-10-CM

## 2023-06-21 MED ORDER — AMLODIPINE BESYLATE 10 MG PO TABS: 10.0000 mg | ORAL_TABLET | Freq: Every day | ORAL | 1 refills | Status: DC

## 2023-06-21 NOTE — Patient Instructions (Signed)
Dislipidemia Dyslipidemia La dislipidemia es un desequilibrio de sustancias cerosas parecidas a la grasa (lpidos) en la sangre. El cuerpo necesita lpidos en pequeas cantidades. Con frecuencia, la dislipidemia implica un nivel alto de colesterol o triglicridos, que son tipos de lpidos. Las formas frecuentes de dislipidemia incluyen las siguientes: Niveles elevados de colesterol LDL. El LDL es el tipo de colesterol que causa la acumulacin de depsitos de grasa (placas) en los vasos sanguneos que transportan la sangre fuera del corazn (arterias). Niveles bajos de colesterol HDL. El HDL es el tipo de colesterol que brinda proteccin contra las enfermedades cardacas. Los niveles altos de HDL eliminan la acumulacin de LDL de las arterias. Niveles altos de triglicridos. Los triglicridos son una sustancia grasa presente en la sangre que se relaciona con la acumulacin de placa en las arterias. Cules son las causas? Hay dos tipos principales de dislipidemia: primaria y secundaria. La dislipidemia primaria es causada por cambios (mutaciones) en los genes que se transmiten a travs de las familias (se heredan). Estas mutaciones causan varios tipos de dislipidemia. La dislipidemia secundaria puede ser causada por diversos factores de riesgo que pueden provocar la enfermedad, como las opciones de estilo de vida y ciertas enfermedades. Qu incrementa el riesgo? Tiene ms probabilidades de desarrollar esta afeccin si es un hombre mayor o si es una mujer que ha pasado por la menopausia. Otros factores de riesgo son los siguientes: Tener antecedentes familiares de dislipidemia. Tomar determinados medicamentos, entre ellos, pldoras anticonceptivas, corticoesteroides, algunos diurticos y betabloqueantes. Seguir una dieta con alto contenido de grasas saturadas. Fumar cigarrillos o beber alcohol en exceso. Tener ciertas afecciones mdicas, como diabetes, sndrome de ovario poliqustico (SOP), enfermedad  renal, enfermedad heptica o hipotiroidismo. No hacer ejercicio regularmente. Tener sobrepeso o ser obeso con demasiada grasa en el abdomen. Cules son los signos o sntomas? En la mayora de los casos, la dislipidemia no causa ningn sntoma. En los casos graves, los niveles muy altos de lpidos pueden causar: Protuberancias de grasa debajo de la piel (xantomas). Un anillo blanco o gris alrededor del centro negro (pupila) del ojo. Los niveles muy altos de triglicridos pueden causar inflamacin del pncreas (pancreatitis). Cmo se diagnostica? Su mdico puede diagnosticar dislipidemia basndose en un anlisis de sangre de rutina (anlisis de sangre en ayunas). Como la mayora de las personas no tienen sntomas de la afeccin, este anlisis de sangre (perfil de lpidos) se realiza en adultos mayores de 20 aos y se repite cada 4 a 6 aos. En este anlisis, se controla lo siguiente: Colesterol total. Esto mide la cantidad total de colesterol en la sangre, que incluye el colesterol LDL, el colesterol HDL y los triglicridos. Un valor saludable est por debajo de 200 mg/dl (5.17 mmol/l). Colesterol LDL. El valor objetivo de colesterol LDL es diferente para cada persona, en funcin de los factores de riesgo individuales. Un valor saludable suele estar por debajo de 100 mg/dl (2.59 mmol/l). Consulte al mdico cul debe ser el valor del colesterol LDL para usted. Colesterol HDL. Un nivel de colesterol HDL de 60 mg/dl (1.55 mmol/l) o superior es lo mejor porque ayuda a proteger contra las enfermedades cardacas. Un valor por debajo de 40 mg/dl (1.03 mmol/l) para los hombres o por debajo de 50 mg/dl (1.29 mmol/l) para las mujeres aumenta el riesgo de enfermedad cardaca. Triglicridos. Un valor de triglicridos saludable est por debajo de 150 mg/dl (1.69 mmol/l). Si su perfil de lpidos es anormal, su mdico puede realizar otros anlisis de sangre. Cmo se trata? El tratamiento   depende del tipo de  dislipidemia que usted tenga y sus otros factores de riesgo de enfermedades cardacas o accidente cerebrovascular. Su mdico tendr un rango objetivo para sus niveles de lpidos en funcin de esta informacin. El tratamiento para la dislipidemia comienza con cambios en el estilo de vida, tales como dieta y ejercicio. [name] podra recomendarle que haga lo siguiente: Hacer ejercicio con regularidad. Realizar cambios en la dieta. Si fuma, dejar de hacerlo. Limitar el consumo de bebidas alcohlicas. Si los cambios en la dieta y la actividad fsica no ayudan a alcanzar sus objetivos, el mdico tambin puede recetarle medicamentos para disminuir los lpidos. El tipo de medicamento recetado con ms frecuencia disminuye el colesterol LDL (estatinas). Si tiene un nivel alto de triglicridos, su mdico puede recetarle otro tipo de frmaco (fibratos) o un suplemento de aceite de pescado con omega-3, o ambos. Siga estas instrucciones en su casa: Comida y bebida  Siga las indicaciones del mdico o el nutricionista respecto de las restricciones para las comidas o las bebidas. Siga una dieta saludable como se lo haya indicado el mdico. Esto puede ayudarle a alcanzar y mantener un peso saludable, reducir el colesterol LDL y aumentar el colesterol HDL. Puede incluir: Limitar sus caloras, si tiene sobrepeso. Comer ms frutas, verduras, cereales integrales, pescado y carnes magras. Limitar las grasas saturadas, las grasas trans y el colesterol. No beba alcohol si: Su mdico le indica no hacerlo. Est embarazada, puede estar embarazada o est tratando de quedar embarazada. Si bebe alcohol: Limite la cantidad que bebe a lo siguiente: De 0 a 1 medida por da para las mujeres. De 0 a 2 medidas por da para los hombres. Sepa cunta cantidad de alcohol hay en las bebidas que toma. En los [place], una medida equivale a una botella de cerveza de 12 oz (355 ml), un vaso de vino de 5 oz (148 ml) o un vaso de  una bebida alcohlica de alta graduacin de 1 oz (44 ml). Actividad Haga ejercicio con regularidad. Siga un programa de ejercicio y entrenamiento de fuerza tal como se lo haya indicado el mdico. Pregntele al mdico qu actividades son seguras para usted. [name] puede recomendarle lo siguiente: 30 minutos de actividad aerbica de 4 a 6 das por semana. La caminata a paso ligero es un ejemplo de actividad aerbica. Entrenamiento de fuerza 2 das por semana. Indicaciones generales No consuma ningn producto que contenga nicotina o tabaco. Estos productos incluyen cigarrillos, tabaco para mascar y aparatos de vapeo, como los cigarrillos electrnicos. Si necesita ayuda para dejar de consumir estos productos, consulte al mdico. Use los medicamentos de venta libre y los recetados solamente como se lo haya indicado el mdico. Esto incluye los suplementos. Concurra a todas las visitas de seguimiento. Esto es importante. Comunquese con un mdico si: Tiene dificultad para cumplir con su plan de actividad fsica o su dieta. Le cuesta dejar de fumar o controlar el consumo de alcohol. Resumen Con frecuencia, la dislipidemia implica un nivel alto de colesterol o triglicridos, que son tipos de lpidos. El tratamiento depende del tipo de dislipidemia que usted tenga y sus otros factores de riesgo de enfermedades cardacas o accidente cerebrovascular. El tratamiento para la dislipidemia comienza con cambios en el estilo de vida, tales como dieta y ejercicio. Su mdico puede recetarle medicamentos para disminuir los lpidos. Esta informacin no tiene como fin reemplazar el consejo del mdico. Asegrese de hacerle al mdico cualquier pregunta que tenga. Document Revised: 06/06/2020 Document Reviewed: 06/06/2020 Elsevier Patient   Education  2024 Elsevier Inc.  

## 2023-06-21 NOTE — Progress Notes (Signed)
 Subjective:  Patient ID: Crystal Fleming, female    DOB: 28-Jul-1975  Age: 48 y.o. MRN: [id number]  CC: Medical Management of Chronic Issues     Discussed the use of AI scribe software for clinical note transcription with the patient, who gave verbal consent to proceed.  History of Present Illness The patient, with a history of hypothyroidism, hypertension, dyslipidemia, presents for a follow-up visit. She reports lifestyle changes, including starting an exercise regimen, in an attempt to manage her high cholesterol. Her cholesterol was last checked in September and was found to be elevated. The patient's risk for vascular disease in 10 years was calculated to be low at 2.1%, based on her risk factors. She denies smoking, diabetes, and uncontrolled hypertension.  The patient also has a history of hypertension, which was previously found to be high when last checked 2 months ago. She is currently on amlodipine, and recent checks of her blood pressure have been within normal limits. She reports occasional pain in the back of her legs, particularly after walking.    Past Medical History:  Diagnosis Date   Hypothyroid 2010    History reviewed. No pertinent surgical history.  Family History  Problem Relation Age of Onset   Hypertension Mother    Breast cancer Neg Hx     Social History   Socioeconomic History   Marital status: Married    Spouse name: Not on file   Number of children: 3   Years of education: Not on file   Highest education level: 3rd grade  Occupational History   Not on file  Tobacco Use   Smoking status: Never   Smokeless tobacco: Never  Vaping Use   Vaping status: Never Used  Substance and Sexual Activity   Alcohol use: No   Drug use: No   Sexual activity: Yes    Birth control/protection: Pill  Other Topics Concern   Not on file  Social History Narrative   Not on file   Social Drivers of Health   Financial Resource Strain: Not on file   Food Insecurity: No Food Insecurity (04/26/2023)   Hunger Vital Sign    Worried About Running Out of Food in the Last Year: Never true    Ran Out of Food in the Last Year: Never true  Transportation Needs: No Transportation Needs (04/26/2023)   PRAPARE - Administrator, Civil Service (Medical): No    Lack of Transportation (Non-Medical): No  Physical Activity: Not on file  Stress: Not on file  Social Connections: Not on file    Allergies  Allergen Reactions   Penicillins     Outpatient Medications Prior to Visit  Medication Sig Dispense Refill   levothyroxine (SYNTHROID) 150 MCG tablet Take 1 tablet (150 mcg total) by mouth daily. 90 tablet 1   amLODipine (NORVASC) 10 MG tablet Take 1 tablet (10 mg total) by mouth daily. 90 tablet 0   norgestimate-ethinyl estradiol (SPRINTEC 28) 0.25-35 MG-MCG tablet Take 1 tablet by mouth once daily (Patient not taking: Reported on 06/21/2023) 84 tablet 0   No facility-administered medications prior to visit.     ROS Review of Systems  Constitutional:  Negative for activity change and appetite change.  HENT:  Negative for sinus pressure and sore throat.   Respiratory:  Negative for chest tightness, shortness of breath and wheezing.   Cardiovascular:  Negative for chest pain and palpitations.  Gastrointestinal:  Negative for abdominal distention, abdominal pain and constipation.  Genitourinary: Negative.  Musculoskeletal: Negative.   Psychiatric/Behavioral:  Negative for behavioral problems and dysphoric mood.     Objective:  BP 139/84   Pulse 82   Ht 5' (1.524 m)   Wt 176 lb (79.8 kg)   SpO2 99%   BMI 34.37 kg/m      06/21/2023    8:43 AM 04/26/2023    8:15 AM 01/13/2023    9:11 AM  BP/Weight  Systolic BP 139 161 126  Diastolic BP 84 82 78  Wt. (Lbs) 176 179.5   BMI 34.37 kg/m2 35.06 kg/m2       Physical Exam Constitutional:      Appearance: She is well-developed.  Cardiovascular:     Rate and Rhythm:  Normal rate.     Heart sounds: Normal heart sounds. No murmur heard. Pulmonary:     Effort: Pulmonary effort is normal.     Breath sounds: Normal breath sounds. No wheezing or rales.  Chest:     Chest wall: No tenderness.  Abdominal:     General: Bowel sounds are normal. There is no distension.     Palpations: Abdomen is soft. There is no mass.     Tenderness: There is no abdominal tenderness.  Musculoskeletal:        General: Normal range of motion.     Right lower leg: No edema.     Left lower leg: No edema.  Neurological:     Mental Status: She is alert and oriented to person, place, and time.  Psychiatric:        Mood and Affect: Mood normal.        Latest Ref Rng & Units 12/22/2022   11:52 AM 05/19/2022    9:50 AM 06/09/2021    9:15 AM  CMP  Glucose 70 - 99 mg/dL 93  95  96   BUN 6 - 24 mg/dL 7  12  15    Creatinine 0.57 - 1.00 mg/dL 4.09  8.11  9.14   Sodium 134 - 144 mmol/L 138  135  139   Potassium 3.5 - 5.2 mmol/L 4.5  4.6  4.2   Chloride 96 - 106 mmol/L 100  99  103   CO2 20 - 29 mmol/L 22  23  23    Calcium 8.7 - 10.2 mg/dL 8.8  9.0  8.9   Total Protein 6.0 - 8.5 g/dL 7.1  7.0  7.0   Total Bilirubin 0.0 - 1.2 mg/dL 0.7  0.7  1.0   Alkaline Phos 44 - 121 IU/L 55  62  59   AST 0 - 40 IU/L 16  11  12    ALT 0 - 32 IU/L 12  11  11      Lipid Panel     Component Value Date/Time   CHOL 229 (H) 12/22/2022 1152   TRIG 177 (H) 12/22/2022 1152   HDL 48 12/22/2022 1152   CHOLHDL 4.3 05/22/2019 1126   CHOLHDL 4.4 12/24/2015 1154   VLDL 39 (H) 12/24/2015 1154   LDLCALC 149 (H) 12/22/2022 1152    CBC    Component Value Date/Time   WBC 5.7 05/19/2022 0950   WBC 5.5 06/16/2016 0939   RBC 4.44 05/19/2022 0950   RBC 4.62 06/16/2016 0939   HGB 12.2 05/19/2022 0950   HCT 37.2 05/19/2022 0950   PLT 292 05/19/2022 0950   MCV 84 05/19/2022 0950   MCH 27.5 05/19/2022 0950   MCH 31.2 06/16/2016 0939   MCHC 32.8 05/19/2022 0950   MCHC 34.0  06/16/2016 0939   RDW 13.9  05/19/2022 0950   LYMPHSABS 1.9 05/19/2022 0950   MONOABS 385 06/16/2016 0939   EOSABS 0.0 05/19/2022 0950   BASOSABS 0.0 05/19/2022 0950    Lab Results  Component Value Date   HGBA1C 5.6 08/11/2022    Lab Results  Component Value Date   TSH 2.500 02/07/2023    The 10-year ASCVD risk score (Arnett DK, et al., 2019) is: 2.1%   Values used to calculate the score:     Age: 32 years     Sex: Female     Is Non-Hispanic African American: No     Diabetic: No     Tobacco smoker: No     Systolic Blood Pressure: 139 mmHg     Is BP treated: Yes     HDL Cholesterol: 48 mg/dL     Total Cholesterol: 229 mg/dL     Assessment & Plan Leg pain Occasional posterior leg pain during ambulation. Stretching recommended. - Advise stretching exercises post-ambulation.  Hypertension Blood pressure controlled with amlodipine. - Continue amlodipine. - Send refill for antihypertensive medication to pharmacy. -Counseled on blood pressure goal of less than 130/80, low-sodium, DASH diet, medication compliance, 150 minutes of moderate intensity exercise per week. Discussed medication compliance, adverse effects.   Hyperlipidemia Cholesterol elevated in September.  Low cardiovascular risk.  Managed with lifestyle modifications. Medication considered if levels remain elevated. - Repeat cholesterol test today. - Continue lifestyle modifications.  Hypothyroidism On levothyroxine. Blood test results pending for dosage evaluation. - Review thyroid function test results. - Adjust levothyroxine dosage if necessary.      Meds ordered this encounter  Medications   amLODipine (NORVASC) 10 MG tablet    Sig: Take 1 tablet (10 mg total) by mouth daily.    Dispense:  90 tablet    Refill:  1    Follow-up: Return in about 6 months (around 12/22/2023) for Chronic medical conditions.       Hoy Register, MD, FAAFP. [facility] [place], [name] [phone]    06/21/2023, 8:58 AM

## 2023-06-22 ENCOUNTER — Other Ambulatory Visit: Payer: Self-pay | Admitting: Family Medicine

## 2023-06-22 DIAGNOSIS — E039 Hypothyroidism, unspecified: Secondary | ICD-10-CM

## 2023-06-22 LAB — CMP14+EGFR
ALT: 16 IU/L (ref 0–32)
AST: 20 IU/L (ref 0–40)
Albumin: 4.4 g/dL (ref 3.9–4.9)
Alkaline Phosphatase: 78 IU/L (ref 44–121)
BUN/Creatinine Ratio: 16 (ref 9–23)
BUN: 11 mg/dL (ref 6–24)
Bilirubin Total: 0.6 mg/dL (ref 0.0–1.2)
CO2: 23 mmol/L (ref 20–29)
Calcium: 9 mg/dL (ref 8.7–10.2)
Chloride: 101 mmol/L (ref 96–106)
Creatinine, Ser: 0.68 mg/dL (ref 0.57–1.00)
Globulin, Total: 3 g/dL (ref 1.5–4.5)
Glucose: 99 mg/dL (ref 70–99)
Potassium: 4.2 mmol/L (ref 3.5–5.2)
Sodium: 138 mmol/L (ref 134–144)
Total Protein: 7.4 g/dL (ref 6.0–8.5)
eGFR: 109 mL/min/{1.73_m2} (ref >59)

## 2023-06-22 LAB — T3: T3, Total: 72 ng/dL (ref 71–180)

## 2023-06-22 LAB — TSH: TSH: 11.2 u[IU]/mL — ABNORMAL HIGH (ref 0.450–4.500)

## 2023-06-22 LAB — LP+NON-HDL CHOLESTEROL
Cholesterol, Total: 242 mg/dL — ABNORMAL HIGH (ref 100–199)
HDL: 45 mg/dL (ref >39)
LDL Chol Calc (NIH): 147 mg/dL — ABNORMAL HIGH (ref 0–99)
Total Non-HDL-Chol (LDL+VLDL): 197 mg/dL — ABNORMAL HIGH (ref 0–129)
Triglycerides: 273 mg/dL — ABNORMAL HIGH (ref 0–149)
VLDL Cholesterol Cal: 50 mg/dL — ABNORMAL HIGH (ref 5–40)

## 2023-06-22 LAB — T4, FREE: Free T4: 1.24 ng/dL (ref 0.82–1.77)

## 2023-06-22 MED ORDER — ATORVASTATIN CALCIUM 20 MG PO TABS
20.0000 mg | ORAL_TABLET | Freq: Every day | ORAL | 1 refills | Status: DC
Start: 2023-06-22 — End: 2023-12-12

## 2023-06-22 MED ORDER — LEVOTHYROXINE SODIUM 175 MCG PO TABS: 175.0000 ug | ORAL_TABLET | Freq: Every day | ORAL | 1 refills | Status: DC

## 2023-12-12 ENCOUNTER — Other Ambulatory Visit: Payer: Self-pay | Admitting: Family Medicine

## 2023-12-12 DIAGNOSIS — E039 Hypothyroidism, unspecified: Secondary | ICD-10-CM

## 2023-12-12 DIAGNOSIS — I10 Essential (primary) hypertension: Secondary | ICD-10-CM

## 2024-01-11 IMAGING — US US PELVIS COMPLETE WITH TRANSVAGINAL
1 series · 13 of 25 positions shown · non-contrast
Comparison: 12/19/2017

CLINICAL DATA: Abnormal uterine bleeding, menorrhagia, question
uterine fibroids, LMP 06/09/2021

EXAM:
TRANSABDOMINAL AND TRANSVAGINAL ULTRASOUND OF PELVIS
TECHNIQUE: Both transabdominal and transvaginal ultrasound examinations of the
pelvis were performed. Transabdominal technique was performed for
global imaging of the pelvis including uterus, ovaries, adnexal
regions, and pelvic cul-de-sac. It was necessary to proceed with
endovaginal exam following the transabdominal exam to visualize the
endometrium and RIGHT ovary.

[Series 1: us pelvic complete with transvaginal · 85 acquisitions, 13 frames shown]
[im 1/85]
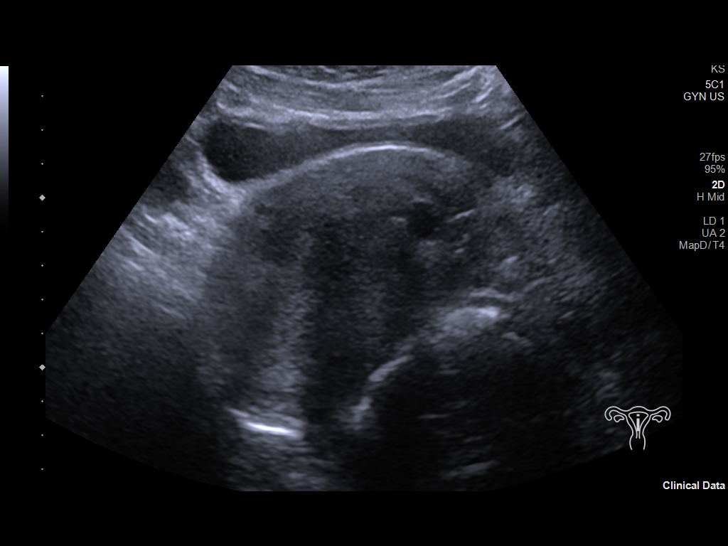
[im 8/85]
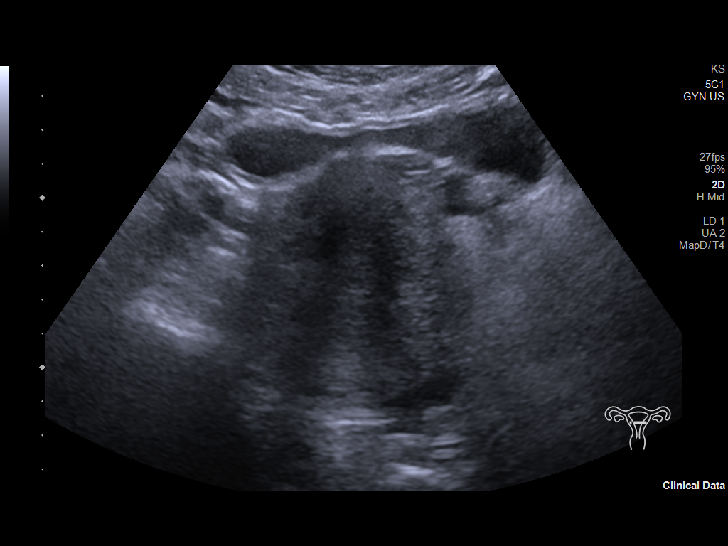
[im 15/85]
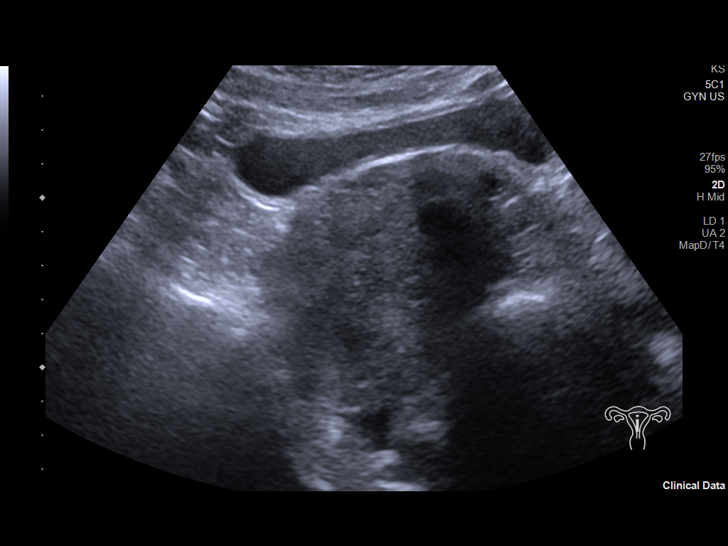
[im 22/85]
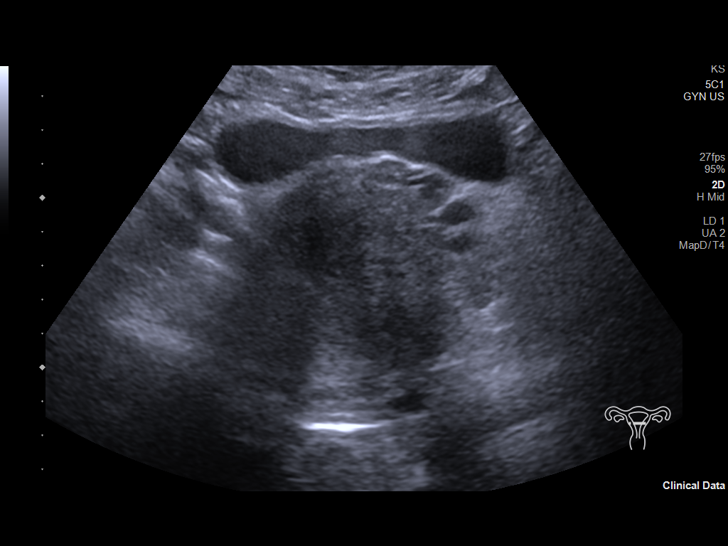
[im 29/85]
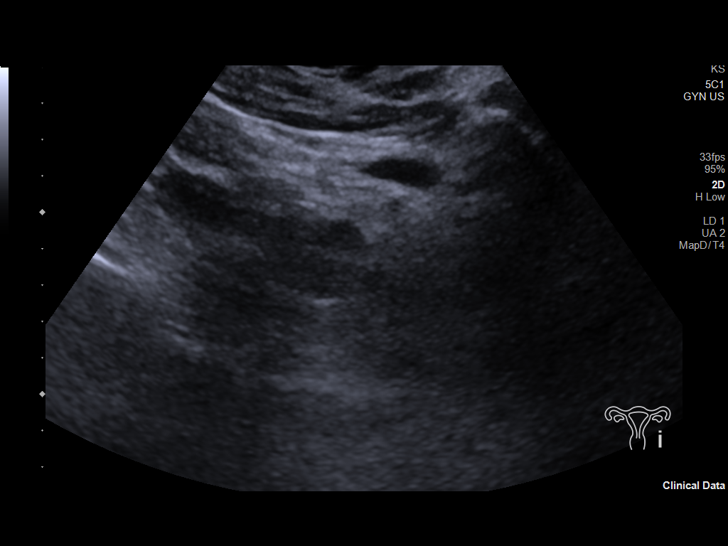
[im 36/85]
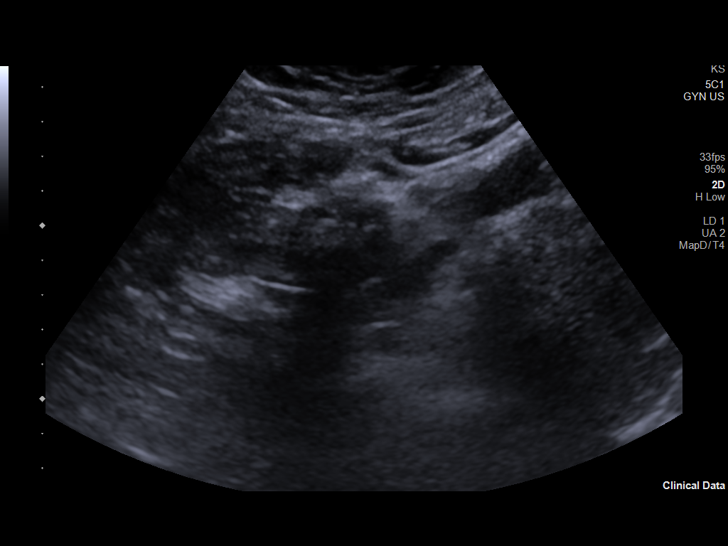
[im 43/85]
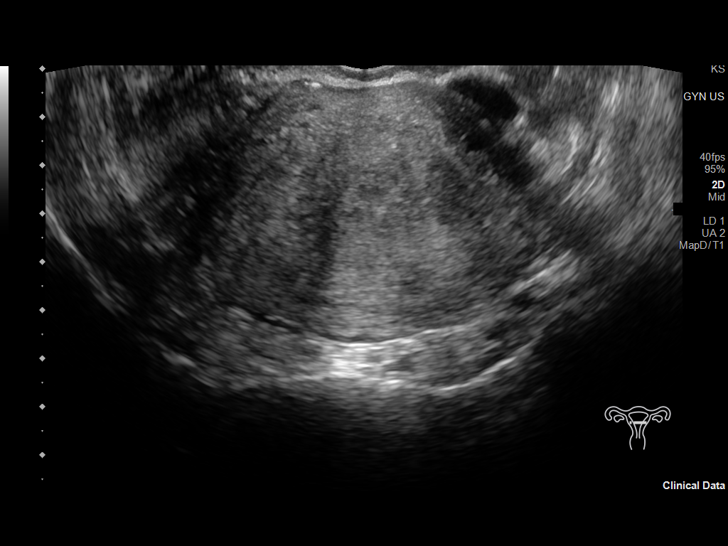
[im 50/85]
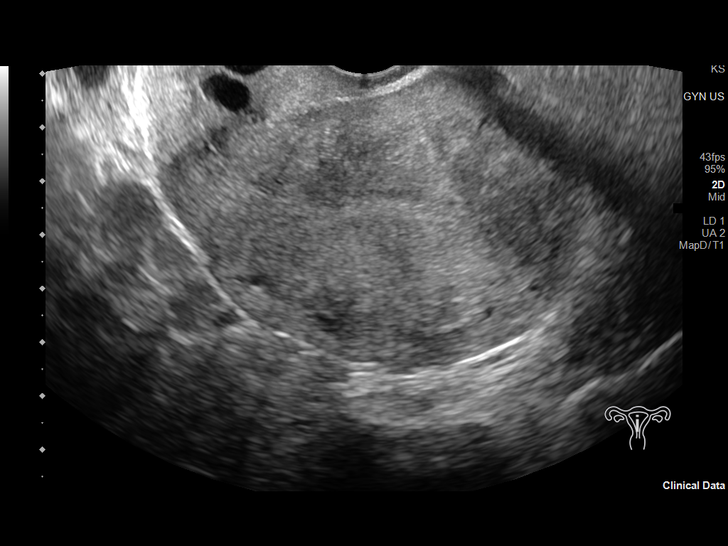
[im 57/85]
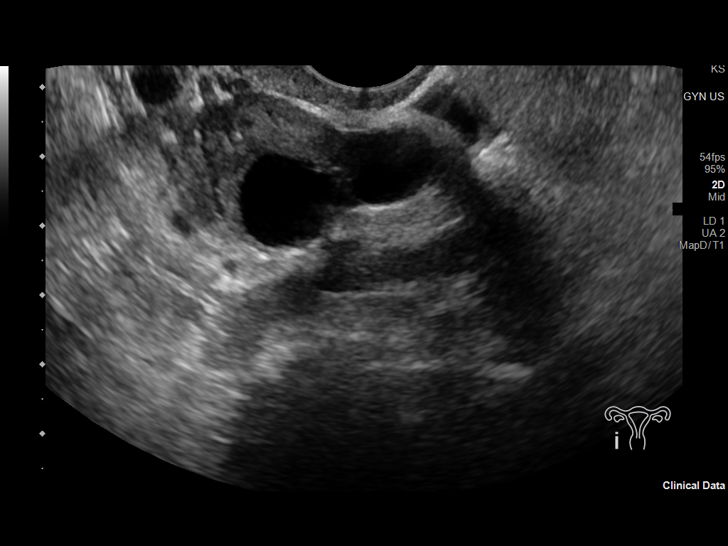
[im 64/85]
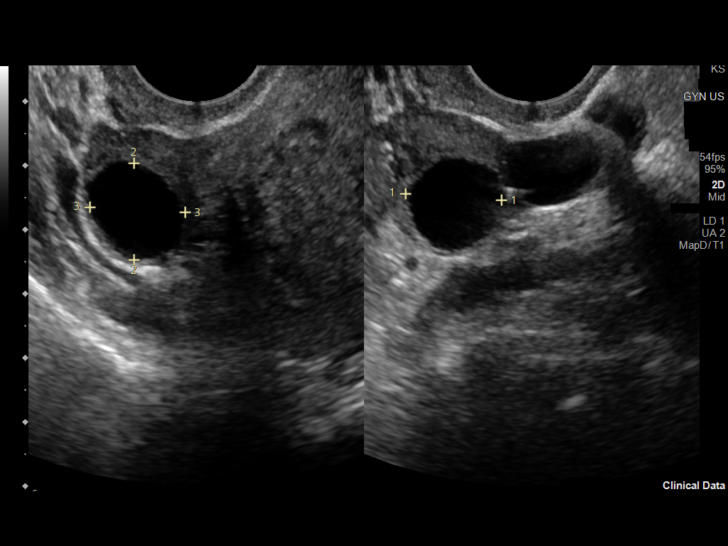
[im 71/85]
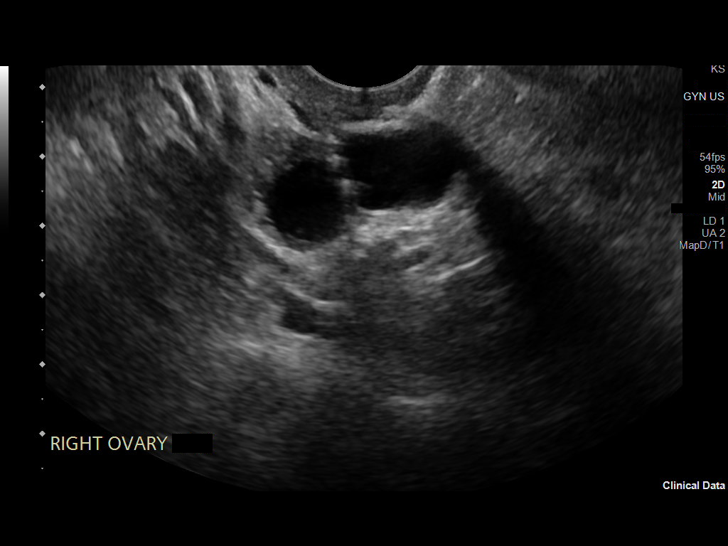
[im 78/85]
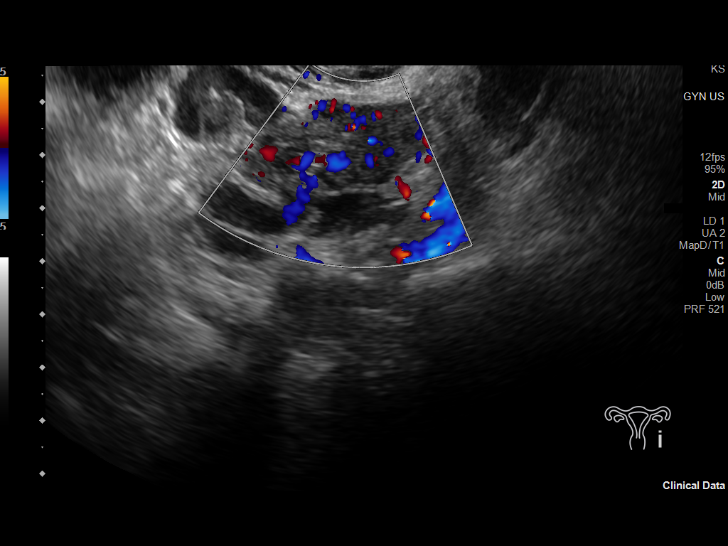
[im 85/85]
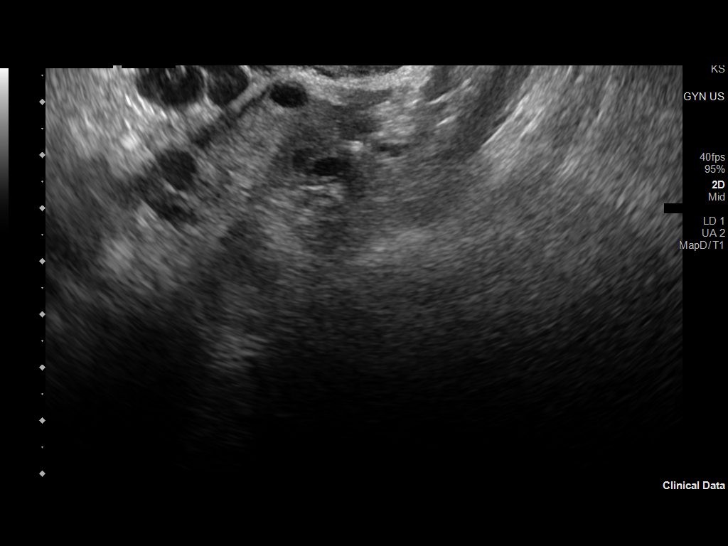

[13 of 25 positions shown; findings below may reference images not displayed]

FINDINGS: Uterus

Measurements: 10.0 x 6.2 x 5.9 cm = volume: 192 mL. Retroverted.
Mildly heterogeneous myometrium. No discrete mass.

Endometrium

Thickness: 5 mm.  No endometrial fluid or mass

Right ovary

Measurements: 2.7 x 2.0 x 1.9 cm = volume: 5.2 mL. Normal morphology
without mass

Left ovary

Measurements: 4.9 x 2.5 x 2.4 cm = volume: 15.3 mL. Normal
morphology without mass

Other findings

No free pelvic fluid. No adnexal masses. Nabothian cysts noted at
cervix.
IMPRESSION: No pelvic sonographic abnormalities.

Specifically, normal appearing endometrial complex without evidence
of uterine leiomyomata.

If bleeding remains unresponsive to hormonal or medical therapy,
sonohysterogram should be considered for focal lesion work-up. (Ref:
Radiological Reasoning: Algorithmic Workup of Abnormal Vaginal
Bleeding with Endovaginal Sonography and Sonohysterography. AJR
[id number]; [id number])

## 2024-01-14 ENCOUNTER — Other Ambulatory Visit: Payer: Self-pay | Admitting: Family Medicine
